# Patient Record
Sex: Female | Born: 2000 | Race: White | Hispanic: No | Smoking: Never smoker
Health system: Southern US, Community
[De-identification: ages and names within clinical notes are randomized; demographics above are authoritative.]

## PROBLEM LIST (undated history)

## (undated) DIAGNOSIS — F329 Major depressive disorder, single episode, unspecified: Secondary | ICD-10-CM

## (undated) DIAGNOSIS — F32A Depression, unspecified: Secondary | ICD-10-CM

## (undated) DIAGNOSIS — G43909 Migraine, unspecified, not intractable, without status migrainosus: Secondary | ICD-10-CM

## (undated) DIAGNOSIS — F419 Anxiety disorder, unspecified: Secondary | ICD-10-CM

## (undated) DIAGNOSIS — R51 Headache: Secondary | ICD-10-CM

## (undated) DIAGNOSIS — M25512 Pain in left shoulder: Secondary | ICD-10-CM

## (undated) DIAGNOSIS — J189 Pneumonia, unspecified organism: Secondary | ICD-10-CM

## (undated) HISTORY — DX: Headache: R51

## (undated) HISTORY — PX: WISDOM TOOTH EXTRACTION: SHX21

---
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
## 2019-11-30 ENCOUNTER — Ambulatory Visit: Payer: BC Managed Care – PPO | Attending: Internal Medicine

## 2019-11-30 DIAGNOSIS — Z20822 Contact with and (suspected) exposure to covid-19: Secondary | ICD-10-CM

## 2019-12-02 LAB — NOVEL CORONAVIRUS, NAA: SARS-CoV-2, NAA: NOT DETECTED

## 2019-12-09 ENCOUNTER — Other Ambulatory Visit: Payer: BC Managed Care – PPO

## 2020-08-18 ENCOUNTER — Other Ambulatory Visit: Payer: Self-pay

## 2020-08-18 ENCOUNTER — Ambulatory Visit
Admission: RE | Admit: 2020-08-18 | Discharge: 2020-08-18 | Disposition: A | Discharge status: Home / Self Care | Payer: BC Managed Care – PPO | Source: Ambulatory Visit | Attending: Emergency Medicine | Admitting: Emergency Medicine

## 2020-08-18 VITALS — BP 122/81 | HR 71 | Temp 99.3°F | Resp 16

## 2020-08-18 DIAGNOSIS — R319 Hematuria, unspecified: Secondary | ICD-10-CM | POA: Diagnosis present

## 2020-08-18 DIAGNOSIS — N390 Urinary tract infection, site not specified: Secondary | ICD-10-CM | POA: Insufficient documentation

## 2020-08-18 LAB — POCT URINALYSIS DIP (MANUAL ENTRY)
Bilirubin, UA: NEGATIVE
Glucose, UA: NEGATIVE mg/dL
Nitrite, UA: POSITIVE — AB
Protein Ur, POC: 100 mg/dL — AB
Spec Grav, UA: 1.025 (ref 1.010–1.025)
Urobilinogen, UA: 1 E.U./dL
pH, UA: 8 (ref 5.0–8.0)

## 2020-08-18 LAB — POCT URINE PREGNANCY: Preg Test, Ur: NEGATIVE

## 2020-08-18 MED ORDER — CEPHALEXIN 500 MG PO CAPS: 500.0000 mg | ORAL_CAPSULE | Freq: Two times a day (BID) | ORAL | 0 refills | Status: AC

## 2020-08-18 NOTE — Discharge Instructions (Signed)
Take the antibiotic as directed.  The urine culture is pending.  We will call you if it shows the need to change or discontinue your antibiotic.    Follow up with your primary care provider if your symptoms are not improving.    

## 2020-08-18 NOTE — ED Triage Notes (Signed)
Patient complains of urinary frequency and lower abdominal pressure. Reports it started yesterday. States that she has also noticed some spotting on the toilet tissue.

## 2020-08-18 NOTE — ED Provider Notes (Signed)
Jenny Grant    CSN: [id number] Arrival date & time: 08/18/20  1614      History   Chief Complaint Chief Complaint  Patient presents with  . Abdominal Pain  . Dysuria    HPI Jenny Grant is a 19 y.o. female.   Patient presents with dysuria and suprapubic pressure x1 day.  She denies fever, chills, back pain, rash, lesions, vaginal discharge, pelvic pain, or other symptoms.  No treatments attempted at home.  The history is provided by the patient.    Past Medical History:  Diagnosis Date  . Depression   . Headache(784.0)   . Migraines     Patient Active Problem List   Diagnosis Date Noted  . Migraine without aura and without status migrainosus, not intractable 05/12/2014  . Tension type headache 05/12/2014  . Anxiety state, unspecified 05/12/2014  . Depressed mood 05/12/2014    Past Surgical History:  Procedure Laterality Date  . WISDOM TOOTH EXTRACTION      OB History   No obstetric history on file.      Home Medications    Prior to Admission medications   Medication Sig Start Date End Date Taking? Authorizing Provider  acetaminophen (TYLENOL) 500 MG tablet Take 500 mg by mouth every 6 (six) hours as needed for mild pain, fever or headache.     [provider]  cephALEXin (KEFLEX) 500 MG capsule Take 1 capsule (500 mg total) by mouth 2 (two) times daily for 5 days. 08/18/20 08/23/20  Mickie Bail, NP  Norgestimate-Ethinyl Estradiol Triphasic (TRI-PREVIFEM) 0.18/0.215/0.25 MG-35 MCG tablet Take 1 tablet by mouth at bedtime.     [provider]  Pseudoephedrine-APAP-DM (DAYQUIL PO) Take 1 capsule by mouth daily as needed (for cold/allergy symptoms).    [provider]  SUMAtriptan (IMITREX) 25 MG tablet Take 25 mg by mouth every 3 (three) hours as needed for migraine. May repeat up to 4 times in 24 hours if headache persists or recurs.    [provider]    Family History Family History  Problem Relation  Age of Onset  . Cerebral palsy Brother        1 brother has CP  . Seizures Brother   . Lung cancer Maternal Grandmother   . Lung cancer Maternal Grandfather   . Cancer Paternal Grandfather     Social History Social History   Tobacco Use  . Smoking status: Never Smoker  . Smokeless tobacco: Never Used  Vaping Use  . Vaping Use: Never used  Substance Use Topics  . Alcohol use: Yes    Comment: occasionally  . Drug use: No     Allergies   Patient has no known allergies.   Review of Systems Review of Systems  Constitutional: Negative for chills and fever.  HENT: Negative for ear pain and sore throat.   Eyes: Negative for pain and visual disturbance.  Respiratory: Negative for cough and shortness of breath.   Cardiovascular: Negative for chest pain and palpitations.  Gastrointestinal: Positive for abdominal pain. Negative for vomiting.  Genitourinary: Positive for dysuria. Negative for flank pain, hematuria, pelvic pain and vaginal discharge.  Musculoskeletal: Negative for arthralgias and back pain.  Skin: Negative for color change and rash.  Neurological: Negative for seizures and syncope.  All other systems reviewed and are negative.    Physical Exam Triage Vital Signs ED Triage Vitals  Enc Vitals Group     BP      Pulse  Resp      Temp      Temp src      SpO2      Weight      Height      Head Circumference      Peak Flow      Pain Score      Pain Loc      Pain Edu?      Excl. in GC?    No data found.  Updated Vital Signs BP 122/81   Pulse 71   Temp 99.3 F (37.4 C)   Resp 16   LMP 08/04/2020 (Within Days)   SpO2 98%   Visual Acuity Right Eye Distance:   Left Eye Distance:   Bilateral Distance:    Right Eye Near:   Left Eye Near:    Bilateral Near:     Physical Exam Vitals and nursing note reviewed.  Constitutional:      General: She is not in acute distress.    Appearance: She is well-developed. She is not ill-appearing.  HENT:       Head: Normocephalic and atraumatic.     Mouth/Throat:     Mouth: Mucous membranes are moist.  Eyes:     Conjunctiva/sclera: Conjunctivae normal.  Cardiovascular:     Rate and Rhythm: Normal rate and regular rhythm.     Heart sounds: No murmur heard.   Pulmonary:     Effort: Pulmonary effort is normal. No respiratory distress.     Breath sounds: Normal breath sounds.  Abdominal:     General: Bowel sounds are normal.     Palpations: Abdomen is soft.     Tenderness: There is abdominal tenderness in the suprapubic area. There is no right CVA tenderness, left CVA tenderness, guarding or rebound.     Comments: Mild suprapubic tenderness to palpation.  No rebound or guarding.  Musculoskeletal:     Cervical back: Neck supple.  Skin:    General: Skin is warm and dry.     Findings: No rash.  Neurological:     General: No focal deficit present.     Mental Status: She is alert and oriented to person, place, and time.     Gait: Gait normal.  Psychiatric:        Mood and Affect: Mood normal.        Behavior: Behavior normal.      UC Treatments / Results  Labs (all labs ordered are listed, but only abnormal results are displayed) Labs Reviewed  POCT URINALYSIS DIP (MANUAL ENTRY) - Abnormal; Notable for the following components:      Result Value   Clarity, UA cloudy (*)    Ketones, POC UA trace (5) (*)    Blood, UA large (*)    Protein Ur, POC =100 (*)    Nitrite, UA Positive (*)    Leukocytes, UA Moderate (2+) (*)    All other components within normal limits  URINE CULTURE  POCT URINE PREGNANCY    EKG   Radiology No results found.  Procedures Procedures (including critical care time)  Medications Ordered in UC Medications - No data to display  Initial Impression / Assessment and Plan / UC Course  I have reviewed the triage vital signs and the nursing notes.  Pertinent labs & imaging results that were available during my care of the patient were reviewed by me  and considered in my medical decision making (see chart for details).   UTI.  Urine culture pending.  Treating with Keflex.  Discussed with patient that we will call her if the culture shows the need to change or discontinue her antibiotic.  Instructed her to follow-up with her PCP if her symptoms are not improving.  Patient agrees to plan of care.   Final Clinical Impressions(s) / UC Diagnoses   Final diagnoses:  Urinary tract infection with hematuria, site unspecified     Discharge Instructions     Take the antibiotic as directed.  The urine culture is pending.  We will call you if it shows the need to change or discontinue your antibiotic.    Follow up with your primary care provider if your symptoms are not improving.       ED Prescriptions    Medication Sig Dispense Auth. Provider   cephALEXin (KEFLEX) 500 MG capsule Take 1 capsule (500 mg total) by mouth 2 (two) times daily for 5 days. 10 capsule Mickie Bail, NP     PDMP not reviewed this encounter.   Mickie Bail, NP 08/18/20 [phone]

## 2020-08-20 LAB — URINE CULTURE: Culture: >=100000 — AB

## 2020-10-28 ENCOUNTER — Other Ambulatory Visit: Payer: Self-pay

## 2020-10-28 ENCOUNTER — Ambulatory Visit
Admission: RE | Admit: 2020-10-28 | Discharge: 2020-10-28 | Disposition: A | Discharge status: Home / Self Care | Payer: BC Managed Care – PPO | Source: Ambulatory Visit | Attending: Emergency Medicine | Admitting: Emergency Medicine

## 2020-10-28 ENCOUNTER — Ambulatory Visit (INDEPENDENT_AMBULATORY_CARE_PROVIDER_SITE_OTHER): Payer: BC Managed Care – PPO

## 2020-10-28 VITALS — BP 138/91 | HR 106 | Temp 98.1°F | Resp 15 | Ht 64.0 in | Wt 115.0 lb

## 2020-10-28 DIAGNOSIS — R079 Chest pain, unspecified: Secondary | ICD-10-CM

## 2020-10-28 DIAGNOSIS — R4589 Other symptoms and signs involving emotional state: Secondary | ICD-10-CM | POA: Diagnosis not present

## 2020-10-28 LAB — CBC WITH DIFFERENTIAL/PLATELET
Abs Immature Granulocytes: 0.01 10*3/uL (ref 0.00–0.07)
Basophils Absolute: 0.1 10*3/uL (ref 0.0–0.1)
Basophils Relative: 1 %
Eosinophils Absolute: 0.1 10*3/uL (ref 0.0–0.5)
Eosinophils Relative: 1 %
HCT: 41.9 % (ref 36.0–46.0)
Hemoglobin: 14.2 g/dL (ref 12.0–15.0)
Immature Granulocytes: 0 %
Lymphocytes Relative: 29 %
Lymphs Abs: 1.9 10*3/uL (ref 0.7–4.0)
MCH: 30 pg (ref 26.0–34.0)
MCHC: 33.9 g/dL (ref 30.0–36.0)
MCV: 88.4 fL (ref 80.0–100.0)
Monocytes Absolute: 0.5 10*3/uL (ref 0.1–1.0)
Monocytes Relative: 8 %
Neutro Abs: 4.1 10*3/uL (ref 1.7–7.7)
Neutrophils Relative %: 61 %
Platelets: 353 10*3/uL (ref 150–400)
RBC: 4.74 MIL/uL (ref 3.87–5.11)
RDW: 13.5 % (ref 11.5–15.5)
WBC: 6.6 10*3/uL (ref 4.0–10.5)
nRBC: 0 % (ref 0.0–0.2)

## 2020-10-28 LAB — BASIC METABOLIC PANEL
Anion gap: 8 (ref 5–15)
BUN: 11 mg/dL (ref 6–20)
CO2: 26 mmol/L (ref 22–32)
Calcium: 9.8 mg/dL (ref 8.9–10.3)
Chloride: 104 mmol/L (ref 98–111)
Creatinine, Ser: 0.83 mg/dL (ref 0.44–1.00)
GFR, Estimated: >60 mL/min (ref >60)
Glucose, Bld: 104 mg/dL — ABNORMAL HIGH (ref 70–99)
Potassium: 3.7 mmol/L (ref 3.5–5.1)
Sodium: 138 mmol/L (ref 135–145)

## 2020-10-28 LAB — TROPONIN I (HIGH SENSITIVITY): Troponin I (High Sensitivity): 3 ng/L (ref <18)

## 2020-10-28 LAB — FIBRIN DERIVATIVES D-DIMER (ARMC ONLY): Fibrin derivatives D-dimer (ARMC): 106.52 ng/mL (FEU) (ref 0.00–499.00)

## 2020-10-28 LAB — TSH: TSH: 0.626 u[IU]/mL (ref 0.350–4.500)

## 2020-10-28 MED ORDER — LIDOCAINE VISCOUS HCL 2 % MT SOLN
15.0000 mL | Freq: Once | OROMUCOSAL | Status: AC
  Administered 2020-10-28: 15 mL via ORAL

## 2020-10-28 MED ORDER — ALUM & MAG HYDROXIDE-SIMETH 200-200-20 MG/5ML PO SUSP
30.0000 mL | Freq: Once | ORAL | Status: AC
  Administered 2020-10-28: 30 mL via ORAL

## 2020-10-28 MED ORDER — HYDROXYZINE HCL 50 MG PO TABS: 50.0000 mg | ORAL_TABLET | Freq: Four times a day (QID) | ORAL | 1 refills | Status: DC | PRN

## 2020-10-28 NOTE — Discharge Instructions (Addendum)
Your labs and imaging were normal.  I do not think that this is a blood clot in your lungs, heart attack, and infection in your heart.  However, follow-up with cardiology as soon as you can.  I am giving you some Vistaril in case anxiety is aggravating the pain.  Can try 400 mg of ibuprofen combined with 500 mg of Tylenol as needed 3-4 times a day for the pain as well.  Go immediately to the emergency department if the pain changes, gets worse, if you become sweaty or nauseous with the pain, for any other concerns.  Here is a list of primary care providers who are taking new patients:  Dr. Normalee Sauer [address] [place] [address] [phone]  [facility] [address] [place], [place] [address] [phone]  [facility] [place] [address] [place] [address]  [phone]  [facility] [address] [place], [place] [address] [phone]  [facility] [address] [place]  [phone] [place], [place] [address]  Here are clinics/ other resources who will see you if you do not have insurance. Some have certain criteria that you must meet. Call them and find out what they are:  [facility]: [address], [place], [place] [address] Phone: [phone] Hours: First and Third Saturdays of each Month, 9 a.m. - 1 p.m.  [facility]: [address], [address] [place], [place] [address] Phone: [phone] Hours: Tuesday, 4 p.m. - 8 p.m. Thursday, 1 p.m. - 8 p.m. Wednesday, 9 a.m. - [facility] [address] [place], [place] [address] Phone: [phone] Pharmacy Phone Number: [phone] Dental Phone Number: [phone] [facility] Insurance Help: [phone]  Dental Hours: Monday - Thursday, 8 a.m. - 6 p.m.  [facility] [address], [place], [place] [address] Phone: [phone] Pharmacy Phone Number: [phone] [facility] Insurance Help:  [phone]  [facility] [address] [place] [place], [place] [address] Phone: [phone] Pharmacy Phone Number: [phone] [facility] Insurance Help: [phone]  [facility] [address] [place], [place] [address] Phone: [phone] [facility] Insurance Help: [phone]   [facility] [address], [place], [place] [address] Phone: [phone]  Go to www.goodrx.com to look up your medications. This will give you a list of where you can find your prescriptions at the most affordable prices. Or ask the pharmacist what the cash price is, or if they have any other discount programs available to help make your medication more affordable. This can be less expensive than what you would pay with insurance.

## 2020-10-28 NOTE — ED Provider Notes (Addendum)
HPI  SUBJECTIVE:  Jenny Grant is a 19 y.o. female who presents with 1 week of substernal chest pain described as tightness that was intermittent that has now become constant today.  She reports sharp substernal chest pain that is present only with breathing.  She reports shortness of breath.  No trauma to the chest, change in physical activity.  No fevers, coughing, wheezing, hemoptysis.  No nausea, diaphoresis, exertional component to this chest pain.  She reports palpitations/elevated heart rate with the chest tightness that can last up to 30 minutes.  She states that she discontinued OCPs 1 month ago.  No calf pain, swelling, surgery in the past 4 weeks, recent immobilization.  She denies stimulant or illicit drug use.  No recent viral illness or  Covid vaccine.  Denies GERD symptoms.  She reports anxiety because of the chest pain, not anxiety followed by chest pain.  She has tried deep breathing which helps.  She also states that lying down helps.  Symptoms are worse with leaning forward, deep inspiration, large positional changes, standing.  She vapes.  She has a history of anxiety and GERD, which has resolved with weight loss.  No history of asthma, emphysema, COPD, diabetes, coronary disease, MI, DVT, PE, hypercholesterolemia, obesity, hypertension, hypothyroidism.  Family history negative for MI, DVT, PE.  LMP: Last week.  Denies the possibility being pregnant.  PMD: None.    Past Medical History:  Diagnosis Date  . Depression   . Headache(784.0)   . Migraines     Past Surgical History:  Procedure Laterality Date  . WISDOM TOOTH EXTRACTION      Family History  Problem Relation Age of Onset  . Cerebral palsy Brother        1 brother has CP  . Seizures Brother   . Lung cancer Maternal Grandmother   . Lung cancer Maternal Grandfather   . Cancer Paternal Grandfather     Social History   Tobacco Use  . Smoking status: Never Smoker  . Smokeless tobacco: Never Used   Vaping Use  . Vaping Use: Every day  Substance Use Topics  . Alcohol use: Yes    Comment: occasionally  . Drug use: Yes    Types: Marijuana     Current Facility-Administered Medications:  .  alum & mag hydroxide-simeth (MAALOX/MYLANTA) 200-200-20 MG/5ML suspension 30 mL, 30 mL, Oral, Once **AND** lidocaine (XYLOCAINE) 2 % viscous mouth solution 15 mL, 15 mL, Oral, Once, Domenick Gong, MD  Current Outpatient Medications:  .  acetaminophen (TYLENOL) 500 MG tablet, Take 500 mg by mouth every 6 (six) hours as needed for mild pain, fever or headache. , Disp: , Rfl:  .  Norgestimate-Ethinyl Estradiol Triphasic (TRI-PREVIFEM) 0.18/0.215/0.25 MG-35 MCG tablet, Take 1 tablet by mouth at bedtime. , Disp: , Rfl:  .  Pseudoephedrine-APAP-DM (DAYQUIL PO), Take 1 capsule by mouth daily as needed (for cold/allergy symptoms)., Disp: , Rfl:  .  SUMAtriptan (IMITREX) 25 MG tablet, Take 25 mg by mouth every 3 (three) hours as needed for migraine. May repeat up to 4 times in 24 hours if headache persists or recurs., Disp: , Rfl:   No Known Allergies   ROS  As noted in HPI.   Physical Exam  BP (!) 138/91 (BP Location: Right Arm)   Pulse (!) 106   Temp 98.1 F (36.7 C) (Oral)   Resp 15   Ht 5\' 4"  (1.626 m)   Wt 52.2 kg   LMP 10/14/2020 (Approximate)   SpO2  100%   BMI 19.74 kg/m   Constitutional: Well developed, well nourished, no acute distress Eyes:  EOMI, conjunctiva normal bilaterally HENT: Normocephalic, atraumatic,mucus membranes moist Respiratory: Normal inspiratory effort, lungs clear bilaterally. Cardiovascular: Regular tachycardia, no murmurs rubs or gallops.  No costochondral tenderness.  No reproducible chest wall tenderness. GI: nondistended, soft, nontender skin: No rash, skin intact Musculoskeletal: Calves symmetric.  Mild bilateral calf tenderness.  No popliteal tenderness.  No palpable cord.  No edema.  No medial thigh tenderness. Neurologic: Alert & oriented x 3, no  focal neuro deficits Psychiatric: Anxious, speech and behavior appropriate   ED Course   Medications  alum & mag hydroxide-simeth (MAALOX/MYLANTA) 200-200-20 MG/5ML suspension 30 mL (has no administration in time range)    And  lidocaine (XYLOCAINE) 2 % viscous mouth solution 15 mL (has no administration in time range)    Orders Placed This Encounter  Procedures  . DG Chest 2 View    Standing Status:   Standing    Number of Occurrences:   1    Order Specific Question:   Symptom/Reason for Exam    Answer:   Chest pain [[id number]]  . Basic metabolic panel    Standing Status:   Standing    Number of Occurrences:   1  . CBC with Differential    Standing Status:   Standing    Number of Occurrences:   1  . Fibrin derivatives D-Dimer    Standing Status:   Standing    Number of Occurrences:   1  . TSH    Standing Status:   Standing    Number of Occurrences:   1  . ED EKG    Standing Status:   Standing    Number of Occurrences:   1    Order Specific Question:   Reason for Exam    Answer:   Chest Pain  . EKG 12-Lead    Standing Status:   Standing    Number of Occurrences:   1    Results for orders placed or performed during the hospital encounter of 10/28/20 (from the past 24 hour(s))  Basic metabolic panel     Status: Abnormal   Collection Time: 10/28/20  5:48 PM  Result Value Ref Range   Sodium 138 135 - 145 mmol/L   Potassium 3.7 3.5 - 5.1 mmol/L   Chloride 104 98 - 111 mmol/L   CO2 26 22 - 32 mmol/L   Glucose, Bld 104 (H) 70 - 99 mg/dL   BUN 11 6 - 20 mg/dL   Creatinine, Ser 2.37 0.44 - 1.00 mg/dL   Calcium 9.8 8.9 - 62.8 mg/dL   GFR, Estimated >31 >51 mL/min   Anion gap 8 5 - 15  CBC with Differential     Status: None   Collection Time: 10/28/20  5:48 PM  Result Value Ref Range   WBC 6.6 4.0 - 10.5 K/uL   RBC 4.74 3.87 - 5.11 MIL/uL   Hemoglobin 14.2 12.0 - 15.0 g/dL   HCT 76.1 36 - 46 %   MCV 88.4 80.0 - 100.0 fL   MCH 30.0 26.0 - 34.0 pg   MCHC 33.9 30.0 -  36.0 g/dL   RDW 60.7 37.1 - 06.2 %   Platelets 353 150 - 400 K/uL   nRBC 0.0 0.0 - 0.2 %   Neutrophils Relative % 61 %   Neutro Abs 4.1 1.7 - 7.7 K/uL   Lymphocytes Relative 29 %   Lymphs Abs  1.9 0.7 - 4.0 K/uL   Monocytes Relative 8 %   Monocytes Absolute 0.5 0.1 - 1.0 K/uL   Eosinophils Relative 1 %   Eosinophils Absolute 0.1 0.0 - 0.5 K/uL   Basophils Relative 1 %   Basophils Absolute 0.1 0.0 - 0.1 K/uL   Immature Granulocytes 0 %   Abs Immature Granulocytes 0.01 0.00 - 0.07 K/uL  Fibrin derivatives D-Dimer     Status: None   Collection Time: 10/28/20  5:48 PM  Result Value Ref Range   Fibrin derivatives D-dimer ([facility]) 106.52 0.00 - 499.00 ng/mL (FEU)  Troponin I (High Sensitivity)     Status: None   Collection Time: 10/28/20  5:48 PM  Result Value Ref Range   Troponin I (High Sensitivity) 3 <18 ng/L   DG Chest 2 View  Result Date: 10/28/2020 CLINICAL DATA:  19 year old female with chest pain. EXAM: CHEST - 2 VIEW COMPARISON:  None. FINDINGS: The heart size and mediastinal contours are within normal limits. Both lungs are clear. The visualized skeletal structures are unremarkable. IMPRESSION: No active cardiopulmonary disease. Electronically Signed   By: Elgie Collard M.D.   On: 10/28/2020 18:52    ED Clinical Impression  1. Chest pain      ED Assessment/Plan  EKG: Normal sinus rhythm, rate 80.  Normal axis, normal intervals.  No hypertrophy.  No ST-T wave changes.  No previous EKG for comparison.  She was symptomatic while EKG was obtained.  Patient has nonreproducible chest wall tenderness.  She was tachycardic on presentation although this normalized on my exam.  However she does have bilateral calf tenderness and recent OCP use so will check a D-dimer.  No evidence of pericarditis on EKG.  Will get troponin to evaluate for myocarditis.  Chest x-ray to evaluate for other causes of chest pain.  TSH because of the palpitations.  Further history, patient states that  she spends a lot of time on her feet, suspect that the calf pain is musculoskeletal because it is bilateral.  Reviewed imaging independently.  Normal chest x-ray.  See radiology report for full details.  will try a GI cocktail.  If she improves with this, then she can start some Pepcid at home.  Labs, imaging reviewed.  BMP normal, D-dimer negative, troponin negative, no anemia.  TSH pending.  Chest x-ray normal.  Unsure as to the etiology of the patient's chest pain, however does not appear to be an MI, myocarditis, PE, pneumonia, dissection, pneumothorax.  Could be proximal muscle SVT, hyperthyroidism.  Ordered referral  to cardiology and providing primary care list for ongoing care.  will try some Vistaril for anxiety in case this is contributing to her chest pain.  Strict ER return precautions.  Discussed labs, imaging, MDM, treatment plan, and plan for follow-up with patient. Discussed sn/sx that should prompt return to the ED. patient agrees with plan.   Meds ordered this encounter  Medications  . AND Linked Order Group   . alum & mag hydroxide-simeth (MAALOX/MYLANTA) 200-200-20 MG/5ML suspension 30 mL   . lidocaine (XYLOCAINE) 2 % viscous mouth solution 15 mL    *This clinic note was created using Scientist, clinical (histocompatibility and immunogenetics). Therefore, there may be occasional mistakes despite careful proofreading.   ?    Domenick Gong, MD 10/28/20 2040    Domenick Gong, MD 10/29/20 [phone]

## 2020-10-28 NOTE — ED Triage Notes (Signed)
Patient c/o chest pain off and on for over a week.  Patient reports that she has been having anxiety and panic attacks recently.  Patient reports pain when she takes a deep breath.

## 2020-11-11 ENCOUNTER — Other Ambulatory Visit: Payer: Self-pay

## 2020-11-11 ENCOUNTER — Ambulatory Visit (INDEPENDENT_AMBULATORY_CARE_PROVIDER_SITE_OTHER): Payer: BC Managed Care – PPO | Admitting: Cardiology

## 2020-11-11 ENCOUNTER — Encounter: Payer: Self-pay | Admitting: Cardiology

## 2020-11-11 VITALS — BP 100/78 | HR 88 | Ht 64.0 in | Wt 114.2 lb

## 2020-11-11 DIAGNOSIS — Z7289 Other problems related to lifestyle: Secondary | ICD-10-CM | POA: Diagnosis not present

## 2020-11-11 DIAGNOSIS — F419 Anxiety disorder, unspecified: Secondary | ICD-10-CM | POA: Diagnosis not present

## 2020-11-11 DIAGNOSIS — R079 Chest pain, unspecified: Secondary | ICD-10-CM | POA: Diagnosis not present

## 2020-11-11 NOTE — Patient Instructions (Signed)
Medication Instructions:  Your physician recommends that you continue on your current medications as directed. Please refer to the Current Medication list given to you today.  *If you need a refill on your cardiac medications before your next appointment, please call your pharmacy*   Lab Work: None ordered If you have labs (blood work) drawn today and your tests are completely normal, you will receive your results only by: Marland Kitchen MyChart Message (if you have MyChart) OR . A paper copy in the mail If you have any lab test that is abnormal or we need to change your treatment, we will call you to review the results.   Testing/Procedures: Your physician has requested that you have an echocardiogram. Echocardiography is a painless test that uses sound waves to create images of your heart. It provides your doctor with information about the size and shape of your heart and how well your heart's chambers and valves are working. This procedure takes approximately one hour. There are no restrictions for this procedure.     Follow-Up: At [facility], you and your health needs are our priority.  As part of our continuing mission to provide you with exceptional heart care, we have created designated Provider Care Teams.  These Care Teams include your primary Cardiologist (physician) and Advanced Practice Providers (APPs -  Physician Assistants and Nurse Practitioners) who all work together to provide you with the care you need, when you need it.  We recommend signing up for the patient portal called "MyChart".  Sign up information is provided on this After Visit Summary.  MyChart is used to connect with patients for Virtual Visits (Telemedicine).  Patients are able to view lab/test results, encounter notes, upcoming appointments, etc.  Non-urgent messages can be sent to your provider as well.   To learn more about what you can do with MyChart, go to [email or web address].    Your next appointment:    After the echo   The format for your next appointment:   In Person  Provider:   You may see Dr. Azucena Cecil or one of the following Advanced Practice Providers on your designated Care Team:    Nicolasa Ducking, NP  Eula Listen, PA-C  Marisue Ivan, PA-C  Cadence Benjamin, [place]  Gillian Shields, NP    Other Instructions  Echocardiogram An echocardiogram is a procedure that uses painless sound waves (ultrasound) to produce an image of the heart. Images from an echocardiogram can provide important information about:  Signs of coronary artery disease (CAD).  Aneurysm detection. An aneurysm is a weak or damaged part of an artery wall that bulges out from the normal force of blood pumping through the body.  Heart size and shape. Changes in the size or shape of the heart can be associated with certain conditions, including heart failure, aneurysm, and CAD.  Heart muscle function.  Heart valve function.  Signs of a past heart attack.  Fluid buildup around the heart.  Thickening of the heart muscle.  A tumor or infectious growth around the heart valves. Tell a health care provider about:  Any allergies you have.  All medicines you are taking, including vitamins, herbs, eye drops, creams, and over-the-counter medicines.  Any blood disorders you have.  Any surgeries you have had.  Any medical conditions you have.  Whether you are pregnant or may be pregnant. What are the risks? Generally, this is a safe procedure. However, problems may occur, including:  Allergic reaction to dye (contrast) that may be used during  the procedure. What happens before the procedure? No specific preparation is needed. You may eat and drink normally. What happens during the procedure?   An IV tube may be inserted into one of your veins.  You may receive image enhancer through this tube. An image enhancer is an injection that improves the quality of the pictures from your heart.  A  gel will be applied to your chest.  A wand-like tool (transducer) will be moved over your chest. The gel will help to transmit the sound waves from the transducer.  The sound waves will harmlessly bounce off of your heart to allow the heart images to be captured in real-time motion. The images will be recorded on a computer. The procedure may vary among health care providers and hospitals. What happens after the procedure?  You may return to your normal, everyday life, including diet, activities, and medicines, unless your health care provider tells you not to do that. Summary  An echocardiogram is a procedure that uses painless sound waves (ultrasound) to produce an image of the heart.  Images from an echocardiogram can provide important information about the size and shape of your heart, heart muscle function, heart valve function, and fluid buildup around your heart.  You do not need to do anything to prepare before this procedure. You may eat and drink normally.  After the echocardiogram is completed, you may return to your normal, everyday life, unless your health care provider tells you not to do that. This information is not intended to replace advice given to you by your health care provider. Make sure you discuss any questions you have with your health care provider. Document Revised: 03/12/2019 Document Reviewed: 12/22/2016 Elsevier Patient Education  2020 [employer].

## 2020-11-11 NOTE — Progress Notes (Signed)
Cardiology Office Note:    Date:  11/11/2020   ID:  Jenny Grant, DOB 2001-11-29, MRN [id number]  PCP:  Herb Grays, MD  [facility] Cardiologist:  No primary care provider on file.  [facility] Electrophysiologist:  None   Referring MD: Domenick Gong, MD   Chief Complaint  Patient presents with  . New Patient (Initial Visit)    Referred by ED for Chest Pain  Pt states she has sharp chest pain that occurs whenever she gets worked up and sometimes randomly, states that it does not last long, chest feels a little tight as well. Has not been able to wear a bra because the pressure can sometimes cause more chest pain.  . Weight Loss    Pt states 6 months ago, she weighed close to 200lbs. She states she does not work out, states "it just happened." She wonders if this could be the cause of her chest pain.   Jenny Grant is a 19 y.o. female who is being seen today for the evaluation of chest pain at the request of Domenick Gong, MD.   History of Present Illness:    Jenny Grant is a 19 y.o. female with a hx of anxiety, currently vapes who presents due to chest pain.  States having symptoms of chest discomfort beginning about 2 weeks ago.  At the time she had a severe panic attack associated with chest pounding and discomfort.  She was seen at urgent care where EKG did not show any ischemia, troponin was normal.  Atarax was given for patient to take as needed as her symptoms are consistent with anxiety.  Her symptoms of panic attack and chest pain improved while on Atarax.  Occasionally has chest discomfort sometimes when she takes a deep breath but not as bad as before.  Denies smoking cigarettes but vapes.  Denies any history of heart disease.  Denies any recent fevers or respiratory or viral illness.    Past Medical History:  Diagnosis Date  . Depression   . Headache(784.0)   . Migraines     Past Surgical History:  Procedure Laterality Date   . WISDOM TOOTH EXTRACTION      Current Medications: Current Meds  Medication Sig  . hydrOXYzine (ATARAX/VISTARIL) 50 MG tablet Take 1 tablet (50 mg total) by mouth every 6 (six) hours as needed for anxiety. May take 2 tabs at night     Allergies:   Patient has no known allergies.   Social History   Socioeconomic History  . Marital status: Single    Spouse name: Not on file  . Number of children: Not on file  . Years of education: Not on file  . Highest education level: Not on file  Occupational History  . Not on file  Tobacco Use  . Smoking status: Never Smoker  . Smokeless tobacco: Never Used  Vaping Use  . Vaping Use: Every day  Substance and Sexual Activity  . Alcohol use: Yes    Comment: occasionally  . Drug use: Yes    Types: Marijuana  . Sexual activity: Yes    Birth control/protection: None  Other Topics Concern  . Not on file  Social History Narrative  . Not on file   Social Determinants of Health   Financial Resource Strain: Not on file  Food Insecurity: Not on file  Transportation Needs: Not on file  Physical Activity: Not on file  Stress: Not on file  Social Connections: Not on file  Family History: The patient's family history includes Cancer in her paternal grandfather; Cerebral palsy in her brother; Lung cancer in her maternal grandfather and maternal grandmother; Seizures in her brother.  ROS:   Please see the history of present illness.     All other systems reviewed and are negative.  EKGs/Labs/Other Studies Reviewed:    The following studies were reviewed today:   EKG:  EKG is  ordered today.  The ekg ordered today demonstrates normal sinus rhythm, normal ECG.  Recent Labs: 10/28/2020: BUN 11; Creatinine, Ser 0.83; Hemoglobin 14.2; Platelets 353; Potassium 3.7; Sodium 138; TSH 0.626  Recent Lipid Panel No results found for: CHOL, TRIG, HDL, CHOLHDL, VLDL, LDLCALC, LDLDIRECT   Risk Assessment/Calculations:      Physical  Exam:    VS:  BP 100/78   Pulse 88   Ht 5\' 4"  (1.626 m)   Wt 114 lb 3.2 oz (51.8 kg)   LMP 10/14/2020 (Approximate)   BMI 19.60 kg/m     Wt Readings from Last 3 Encounters:  11/11/20 114 lb 3.2 oz (51.8 kg)  10/28/20 115 lb (52.2 kg)  05/04/17 120 lb (54.4 kg) (42 %, Z= -0.20)*   * Growth percentiles are based on CDC (Girls, 2-20 Years) data.     GEN:  Well nourished, well developed in no acute distress HEENT: Normal NECK: No JVD; No carotid bruits LYMPHATICS: No lymphadenopathy CARDIAC: RRR, no murmurs, rubs, gallops RESPIRATORY:  Clear to auscultation without rales, wheezing or rhonchi  ABDOMEN: Soft, non-tender, non-distended MUSCULOSKELETAL:  No edema; No deformity  SKIN: Warm and dry NEUROLOGIC:  Alert and oriented x 3 PSYCHIATRIC:  Normal affect   ASSESSMENT:    1. Chest pain of uncertain etiology   2. Anxiety   3. Engages in vaping    PLAN:    In order of problems listed above:  1. Patient with chest pain not associated with exertion.  Has some pleuritic component to her symptoms.  Get echocardiogram to evaluate for pericardial effusion, cardiac function.  Symptoms are not consistent with CAD, patient is young, has no risk factors.  Anxiety most likely playing a role. 2. History of anxiety, recommend patient establish with a primary care provider/psychiatrist for adequate management of anxiety. 3. Currently uses e-cigarettes.  Cessation advised.  follow up after echocardiogram.     Medication Adjustments/Labs and Tests Ordered: Current medicines are reviewed at length with the patient today.  Concerns regarding medicines are outlined above.  Orders Placed This Encounter  Procedures  . EKG 12-Lead  . ECHOCARDIOGRAM COMPLETE   No orders of the defined types were placed in this encounter.   Patient Instructions  Medication Instructions:  Your physician recommends that you continue on your current medications as directed. Please refer to the Current  Medication list given to you today.  *If you need a refill on your cardiac medications before your next appointment, please call your pharmacy*   Lab Work: None ordered If you have labs (blood work) drawn today and your tests are completely normal, you will receive your results only by: 07/04/17 MyChart Message (if you have MyChart) OR . A paper copy in the mail If you have any lab test that is abnormal or we need to change your treatment, we will call you to review the results.   Testing/Procedures: Your physician has requested that you have an echocardiogram. Echocardiography is a painless test that uses sound waves to create images of your heart. It provides your doctor with information about  the size and shape of your heart and how well your heart's chambers and valves are working. This procedure takes approximately one hour. There are no restrictions for this procedure.     Follow-Up: At [facility], you and your health needs are our priority.  As part of our continuing mission to provide you with exceptional heart care, we have created designated Provider Care Teams.  These Care Teams include your primary Cardiologist (physician) and Advanced Practice Providers (APPs -  Physician Assistants and Nurse Practitioners) who all work together to provide you with the care you need, when you need it.  We recommend signing up for the patient portal called "MyChart".  Sign up information is provided on this After Visit Summary.  MyChart is used to connect with patients for Virtual Visits (Telemedicine).  Patients are able to view lab/test results, encounter notes, upcoming appointments, etc.  Non-urgent messages can be sent to your provider as well.   To learn more about what you can do with MyChart, go to [email or web address].    Your next appointment:   After the echo   The format for your next appointment:   In Person  Provider:   You may see Dr. Azucena Cecil or one of the following  Advanced Practice Providers on your designated Care Team:    Nicolasa Ducking, NP  Eula Listen, PA-C  Marisue Ivan, PA-C  Cadence Clayton, [place]  Gillian Shields, NP    Other Instructions  Echocardiogram An echocardiogram is a procedure that uses painless sound waves (ultrasound) to produce an image of the heart. Images from an echocardiogram can provide important information about:  Signs of coronary artery disease (CAD).  Aneurysm detection. An aneurysm is a weak or damaged part of an artery wall that bulges out from the normal force of blood pumping through the body.  Heart size and shape. Changes in the size or shape of the heart can be associated with certain conditions, including heart failure, aneurysm, and CAD.  Heart muscle function.  Heart valve function.  Signs of a past heart attack.  Fluid buildup around the heart.  Thickening of the heart muscle.  A tumor or infectious growth around the heart valves. Tell a health care provider about:  Any allergies you have.  All medicines you are taking, including vitamins, herbs, eye drops, creams, and over-the-counter medicines.  Any blood disorders you have.  Any surgeries you have had.  Any medical conditions you have.  Whether you are pregnant or may be pregnant. What are the risks? Generally, this is a safe procedure. However, problems may occur, including:  Allergic reaction to dye (contrast) that may be used during the procedure. What happens before the procedure? No specific preparation is needed. You may eat and drink normally. What happens during the procedure?   An IV tube may be inserted into one of your veins.  You may receive image enhancer through this tube. An image enhancer is an injection that improves the quality of the pictures from your heart.  A gel will be applied to your chest.  A wand-like tool (transducer) will be moved over your chest. The gel will help to transmit the sound  waves from the transducer.  The sound waves will harmlessly bounce off of your heart to allow the heart images to be captured in real-time motion. The images will be recorded on a computer. The procedure may vary among health care providers and hospitals. What happens after the procedure?  You may return to  your normal, everyday life, including diet, activities, and medicines, unless your health care provider tells you not to do that. Summary  An echocardiogram is a procedure that uses painless sound waves (ultrasound) to produce an image of the heart.  Images from an echocardiogram can provide important information about the size and shape of your heart, heart muscle function, heart valve function, and fluid buildup around your heart.  You do not need to do anything to prepare before this procedure. You may eat and drink normally.  After the echocardiogram is completed, you may return to your normal, everyday life, unless your health care provider tells you not to do that. This information is not intended to replace advice given to you by your health care provider. Make sure you discuss any questions you have with your health care provider. Document Revised: 03/12/2019 Document Reviewed: 12/22/2016 Elsevier Patient Education  2020 [employer]      Signed, Debbe OdeaBrian Agbor-Etang, MD  11/11/2020 4:42 PM    [facility]

## 2020-12-16 ENCOUNTER — Other Ambulatory Visit: Payer: Medicaid Other

## 2020-12-23 ENCOUNTER — Ambulatory Visit: Payer: Medicaid Other | Admitting: Cardiology

## 2021-01-13 ENCOUNTER — Ambulatory Visit
Admission: RE | Admit: 2021-01-13 | Discharge: 2021-01-13 | Disposition: A | Discharge status: Home / Self Care | Payer: Medicaid Other | Source: Ambulatory Visit

## 2021-01-13 ENCOUNTER — Other Ambulatory Visit: Payer: Self-pay

## 2021-01-13 VITALS — BP 114/63 | HR 77 | Temp 98.4°F | Resp 18 | Ht 64.0 in | Wt 128.0 lb

## 2021-01-13 DIAGNOSIS — U071 COVID-19: Secondary | ICD-10-CM

## 2021-01-13 NOTE — ED Triage Notes (Signed)
Pt c/o nasal congestion, chest congestion, cough and chest tightness with recent COVID positive test (01/10/21, at-home, pt reports 3 negatives and then 1 positive). Pt denies f/n/v/d or other symptoms. Pt is currently [redacted] weeks pregnant.

## 2021-01-13 NOTE — ED Provider Notes (Signed)
[facility]    CSN: [id number] Arrival date & time: 01/13/21  0945      History   Chief Complaint Chief Complaint  Patient presents with  . Cough  . Covid Positive    01/10/21    HPI Jenny Grant is a 20 y.o. female.   HPI   20 year old female here for evaluation of nasal congestion, chest congestion, chest tightness, and cough.  Patient reports that her symptoms started on 01/10/2021.  That day she took a home Covid test and was positive.  Patient denies fever, wheezing, vomiting, or diarrhea.  Patient has associated symptoms of a mild runny nose, sore throat which is improving, headache which has resolved, and nausea.  Patient is currently [redacted] weeks pregnant and is under the care of an obstetrician who directed her to come to urgent care to rule out pneumonia.  Past Medical History:  Diagnosis Date  . Depression   . Headache(784.0)   . Migraines     Patient Active Problem List   Diagnosis Date Noted  . Migraine without aura and without status migrainosus, not intractable 05/12/2014  . Tension type headache 05/12/2014  . Anxiety state, unspecified 05/12/2014  . Depressed mood 05/12/2014    Past Surgical History:  Procedure Laterality Date  . WISDOM TOOTH EXTRACTION      OB History    Gravida  1   Para      Term      Preterm      AB      Living        SAB      IAB      Ectopic      Multiple      Live Births               Home Medications    Prior to Admission medications   Medication Sig Start Date End Date Taking? Authorizing Provider  Prenatal Multivit-Min-Fe-FA (PRENATAL, W/IRON & FA,) 27-0.8 MG TABS Take 1 tablet by mouth daily.    [provider]  Norgestimate-Ethinyl Estradiol Triphasic (TRI-PREVIFEM) 0.18/0.215/0.25 MG-35 MCG tablet Take 1 tablet by mouth at bedtime.   10/28/20  [provider]    Family History Family History  Problem Relation Age of Onset  . Cerebral palsy Brother         1 brother has CP  . Seizures Brother   . Lung cancer Maternal Grandmother   . Lung cancer Maternal Grandfather   . Cancer Paternal Grandfather     Social History Social History   Tobacco Use  . Smoking status: Never Smoker  . Smokeless tobacco: Never Used  Vaping Use  . Vaping Use: Every day  Substance Use Topics  . Alcohol use: Yes    Comment: occasionally  . Drug use: Yes    Types: Marijuana     Allergies   Patient has no known allergies.   Review of Systems Review of Systems  Constitutional: Negative for activity change, appetite change and fever.  HENT: Positive for congestion, rhinorrhea and sore throat. Negative for ear pain.   Respiratory: Positive for cough and chest tightness. Negative for wheezing.   Gastrointestinal: Positive for nausea. Negative for diarrhea and vomiting.  Musculoskeletal: Negative for arthralgias.  Skin: Negative for rash.  Neurological: Positive for headaches.  Hematological: Negative.   Psychiatric/Behavioral: Negative.      Physical Exam Triage Vital Signs ED Triage Vitals  Enc Vitals Group     BP 01/13/21 1001  114/63     Pulse Rate 01/13/21 1001 77     Resp 01/13/21 1001 18     Temp 01/13/21 1001 98.4 F (36.9 C)     Temp Source 01/13/21 1001 Oral     SpO2 01/13/21 1001 100 %     Weight 01/13/21 0959 128 lb (58.1 kg)     Height 01/13/21 0959 _0  (1.626 m)     Head Circumference --      Peak Flow --      Pain Score 01/13/21 0959 0     Pain Loc --      Pain Edu? --      Excl. in [place]? --    No data found.  Updated Vital Signs BP 114/63 (BP Location: Left Arm)   Pulse 77   Temp 98.4 F (36.9 C) (Oral)   Resp 18   Ht _1  (1.626 m)   Wt 128 lb (58.1 kg)   LMP 10/14/2020 (Approximate)   SpO2 100%   BMI 21.97 kg/m   Visual Acuity Right Eye Distance:   Left Eye Distance:   Bilateral Distance:    Right Eye Near:   Left Eye Near:    Bilateral Near:     Physical Exam Vitals and nursing note reviewed.   Constitutional:      General: She is not in acute distress.    Appearance: Normal appearance. She is not ill-appearing.  HENT:     Head: Normocephalic and atraumatic.     Right Ear: Tympanic membrane, ear canal and external ear normal.     Left Ear: Tympanic membrane, ear canal and external ear normal.     Nose: Congestion and rhinorrhea present.     Comments: His mucosa is erythematous and edematous with clear nasal discharge.    Mouth/Throat:     Mouth: Mucous membranes are moist.     Pharynx: Oropharynx is clear. No oropharyngeal exudate or posterior oropharyngeal erythema.  Neck:     Comments: Patient has mild, anterior, shotty cervical lymphadenopathy bilaterally. Cardiovascular:     Rate and Rhythm: Normal rate and regular rhythm.     Pulses: Normal pulses.     Heart sounds: Normal heart sounds. No murmur heard. No gallop.   Pulmonary:     Effort: Pulmonary effort is normal.     Breath sounds: Normal breath sounds. No wheezing, rhonchi or rales.  Musculoskeletal:     Cervical back: Normal range of motion and neck supple.  Lymphadenopathy:     Cervical: Cervical adenopathy present.  Skin:    General: Skin is warm and dry.     Capillary Refill: Capillary refill takes less than 2 seconds.     Findings: No erythema or rash.  Neurological:     General: No focal deficit present.     Mental Status: She is alert and oriented to person, place, and time.  Psychiatric:        Mood and Affect: Mood normal.        Behavior: Behavior normal.        Thought Content: Thought content normal.        Judgment: Judgment normal.      UC Treatments / Results  Labs (all labs ordered are listed, but only abnormal results are displayed) Labs Reviewed - No data to display  EKG   Radiology No results found.  Procedures Procedures (including critical care time)  Medications Ordered in UC Medications - No data to display  Initial Impression /  Assessment and Plan / UC Course  I  have reviewed the triage vital signs and the nursing notes.  Pertinent labs & imaging results that were available during my care of the patient were reviewed by me and considered in my medical decision making (see chart for details).   Is a very pleasant 20 year old female who was sent in by her obstetrician for evaluation and to rule out pneumonia.  Patient is [redacted] weeks pregnant and tested +3 days ago for COVID on at home test.  Patient is nontoxic in appearance and does not demonstrate any tachypnea or respiratory distress.  Physical exam does reveal erythematous and edematous nasal mucosa with clear discharge.  Posterior oropharynx is unremarkable.  Patient does have bilateral anterior cervical lymphadenopathy.  Lung sounds are clear to auscultation in all fields.  Will discharge patient home with supportive care and have her follow-up with her obstetrician for any new or worsening symptoms.  ER precautions given.   Final Clinical Impressions(s) / UC Diagnoses   Final diagnoses:  [id number]     Discharge Instructions     You need to continue isolating at home for another 3 days.  You can break quarantine after 5 days from your positive test if your symptoms have improved and you have not had a fever for 24 hours.  Continue use Tylenol as needed for fever and pain.  You can use plain Robitussin cough syrup as needed for cough.  You may use a NeilMed sinus rinse kit and distilled water to help alleviate your nasal congestion.  Do not use tap water.  If you develop shortness of breath-especially at rest, you cannot catch her breath at rest, you cannot speak in full sentences, or is a late sign your lip start turning blue you need to go to the ER for evaluation.    ED Prescriptions    None     PDMP not reviewed this encounter.   Margarette Canada, NP 01/13/21 1032

## 2021-01-13 NOTE — Discharge Instructions (Signed)
You need to continue isolating at home for another 3 days.  You can break quarantine after 5 days from your positive test if your symptoms have improved and you have not had a fever for 24 hours.  Continue use Tylenol as needed for fever and pain.  You can use plain Robitussin cough syrup as needed for cough.  You may use a NeilMed sinus rinse kit and distilled water to help alleviate your nasal congestion.  Do not use tap water.  If you develop shortness of breath-especially at rest, you cannot catch her breath at rest, you cannot speak in full sentences, or is a late sign your lip start turning blue you need to go to the ER for evaluation.

## 2021-02-06 LAB — OB RESULTS CONSOLE RUBELLA ANTIBODY, IGM: Rubella: NON-IMMUNE/NOT IMMUNE

## 2021-02-06 LAB — OB RESULTS CONSOLE VARICELLA ZOSTER ANTIBODY, IGG: Varicella: NON-IMMUNE/NOT IMMUNE

## 2021-02-06 LAB — OB RESULTS CONSOLE HEPATITIS B SURFACE ANTIGEN: Hepatitis B Surface Ag: NEGATIVE

## 2021-03-15 ENCOUNTER — Telehealth: Payer: Self-pay | Admitting: Licensed Clinical Social Worker

## 2021-03-15 NOTE — Telephone Encounter (Signed)
From [name] "Jenny Grant this member requests an appointment with you. She reports a history of Anxiety, She reports that she had a panic attack in December 2021 and was seen in Urgent Care and given meds, Member reports anxious thoughts currently. Please accept this as her referral. Thanks"

## 2021-03-22 ENCOUNTER — Ambulatory Visit
Admission: RE | Admit: 2021-03-22 | Discharge: 2021-03-22 | Disposition: A | Discharge status: Home / Self Care | Payer: Medicaid Other | Source: Ambulatory Visit | Attending: Family Medicine | Admitting: Family Medicine

## 2021-03-22 ENCOUNTER — Other Ambulatory Visit: Payer: Self-pay

## 2021-03-22 VITALS — BP 114/61 | HR 79 | Temp 98.4°F | Resp 18 | Ht 64.0 in | Wt 150.0 lb

## 2021-03-22 DIAGNOSIS — J029 Acute pharyngitis, unspecified: Secondary | ICD-10-CM

## 2021-03-22 DIAGNOSIS — R0981 Nasal congestion: Secondary | ICD-10-CM | POA: Diagnosis not present

## 2021-03-22 DIAGNOSIS — R519 Headache, unspecified: Secondary | ICD-10-CM | POA: Diagnosis not present

## 2021-03-22 LAB — GROUP A STREP BY PCR: Group A Strep by PCR: NOT DETECTED

## 2021-03-22 MED ORDER — LIDOCAINE VISCOUS HCL 2 % MT SOLN: 15.0000 mL | OROMUCOSAL | 0 refills | Status: AC | PRN

## 2021-03-22 NOTE — ED Triage Notes (Signed)
Pt c/o sore throat, headache that started yesterday morning. Pt states had covid in February according to at home test

## 2021-03-22 NOTE — Discharge Instructions (Signed)
Strep test is negative.  This is likely a viral illness.  Increase rest and fluids at this time.  I have sent viscous lidocaine that he can use to swish around and then spit to help numb your throat.  You can also use over-the-counter Chloraseptic spray.  Can take Tylenol for pain relief and continue with the Claritin.  Please follow-up for any worsening symptoms or if you are not better after 10 days.

## 2021-03-22 NOTE — ED Provider Notes (Signed)
[facility]    CSN: [id number] Arrival date & time: 03/22/21  1746      History   Chief Complaint Chief Complaint  Patient presents with  . Sore Throat    HPI Jenny Grant is a 20 y.o. female presenting for 2-day history of sore throat, painful swallowing, headaches and nasal congestion.  Denies any fever, fatigue, body aches, cough, chest pain, breathing difficulty, nausea/vomiting or diarrhea.  No sick contacts.  No known exposure to COVID-19.  Admits to personal history of COVID-19 in February 2022.  Patient is taking Claritin and Tylenol for symptoms as the Tylenol has helped a little.  Patient says that she did go to church on Sunday and thinks she might of been around some sick people at that time possibly.  She is currently [redacted] weeks pregnant.  She has no other concerns.  HPI  Past Medical History:  Diagnosis Date  . Depression   . Headache(784.0)   . Migraines     Patient Active Problem List   Diagnosis Date Noted  . Migraine without aura and without status migrainosus, not intractable 05/12/2014  . Tension type headache 05/12/2014  . Anxiety state, unspecified 05/12/2014  . Depressed mood 05/12/2014    Past Surgical History:  Procedure Laterality Date  . WISDOM TOOTH EXTRACTION      OB History    Gravida  1   Para      Term      Preterm      AB      Living        SAB      IAB      Ectopic      Multiple      Live Births               Home Medications    Prior to Admission medications   Medication Sig Start Date End Date Taking? Authorizing Provider  lidocaine (XYLOCAINE) 2 % solution Use as directed 15 mLs in the mouth or throat as needed for up to 5 days for mouth pain (swish and spit). 03/22/21 03/27/21 Yes Shirlee Latch, PA-C  Prenatal Multivit-Min-Fe-FA (PRENATAL, W/IRON & FA,) 27-0.8 MG TABS Take 1 tablet by mouth daily.   Yes [provider]  Norgestimate-Ethinyl Estradiol Triphasic (TRI-PREVIFEM)  0.18/0.215/0.25 MG-35 MCG tablet Take 1 tablet by mouth at bedtime.   10/28/20  [provider]    Family History Family History  Problem Relation Age of Onset  . Cerebral palsy Brother        1 brother has CP  . Seizures Brother   . Lung cancer Maternal Grandmother   . Lung cancer Maternal Grandfather   . Cancer Paternal Grandfather     Social History Social History   Tobacco Use  . Smoking status: Never Smoker  . Smokeless tobacco: Never Used  Vaping Use  . Vaping Use: Every day  Substance Use Topics  . Alcohol use: Yes    Comment: occasionally  . Drug use: Yes    Types: Marijuana     Allergies   Patient has no known allergies.   Review of Systems Review of Systems  Constitutional: Negative for chills, diaphoresis, fatigue and fever.  HENT: Positive for congestion and sore throat. Negative for ear pain, rhinorrhea, sinus pressure and sinus pain.   Respiratory: Negative for cough and shortness of breath.   Gastrointestinal: Negative for abdominal pain, nausea and vomiting.  Musculoskeletal: Negative for arthralgias and myalgias.  Skin: Negative for rash.  Neurological: Positive for headaches. Negative for weakness.  Hematological: Negative for adenopathy.     Physical Exam Triage Vital Signs ED Triage Vitals  Enc Vitals Group     BP 03/22/21 1755 114/61     Pulse Rate 03/22/21 1755 79     Resp 03/22/21 1755 18     Temp 03/22/21 1755 98.4 F (36.9 C)     Temp Source 03/22/21 1755 Oral     SpO2 03/22/21 1755 98 %     Weight 03/22/21 1753 150 lb (68 kg)     Height 03/22/21 1753 5\' 4"  (1.626 m)     Head Circumference --      Peak Flow --      Pain Score 03/22/21 1753 6     Pain Loc --      Pain Edu? --      Excl. in GC? --    No data found.  Updated Vital Signs BP 114/61 (BP Location: Left Arm)   Pulse 79   Temp 98.4 F (36.9 C) (Oral)   Resp 18   Ht 5\' 4"  (1.626 m)   Wt 150 lb (68 kg)   LMP 10/14/2020 (Approximate)   SpO2 98%   BMI  25.75 kg/m       Physical Exam Vitals and nursing note reviewed.  Constitutional:      General: She is not in acute distress.    Appearance: Normal appearance. She is not ill-appearing or toxic-appearing.  HENT:     Head: Normocephalic and atraumatic.     Nose: Congestion and rhinorrhea present.     Mouth/Throat:     Mouth: Mucous membranes are moist.     Pharynx: Oropharynx is clear. Posterior oropharyngeal erythema present.  Eyes:     General: No scleral icterus.       Right eye: No discharge.        Left eye: No discharge.     Conjunctiva/sclera: Conjunctivae normal.  Cardiovascular:     Rate and Rhythm: Normal rate and regular rhythm.     Heart sounds: Normal heart sounds.  Pulmonary:     Effort: Pulmonary effort is normal. No respiratory distress.     Breath sounds: Normal breath sounds.  Musculoskeletal:     Cervical back: Neck supple.  Skin:    General: Skin is dry.  Neurological:     General: No focal deficit present.     Mental Status: She is alert. Mental status is at baseline.     Motor: No weakness.     Gait: Gait normal.  Psychiatric:        Mood and Affect: Mood normal.        Behavior: Behavior normal.        Thought Content: Thought content normal.      UC Treatments / Results  Labs (all labs ordered are listed, but only abnormal results are displayed) Labs Reviewed  GROUP A STREP BY PCR    EKG   Radiology No results found.  Procedures Procedures (including critical care time)  Medications Ordered in UC Medications - No data to display  Initial Impression / Assessment and Plan / UC Course  I have reviewed the triage vital signs and the nursing notes.  Pertinent labs & imaging results that were available during my care of the patient were reviewed by me and considered in my medical decision making (see chart for details).   20 year old female presenting for sore throat, congestion  and headaches x2 days.  Vital signs are normal and  stable in clinic.  Exam is reassuring.  She does have some minor nasal congestion and clear rhinorrhea as well as mild posterior pharyngeal erythema.  Chest is clear to auscultation heart regular rate and rhythm.  Molecular strep test is negative.  Patient has history of COVID-19 in February 2022.  Not concerned for reinfection at this time and good possibility she could test positive still.  Advised this is likely a viral illness and supportive care encouraged with increasing rest and fluids.  Advised to continue with Claritin and Tylenol.  I have sent viscous lidocaine for as well.  Advised to follow-up with our department or PCP for any worsening symptoms or if not better after 10 days.   Final Clinical Impressions(s) / UC Diagnoses   Final diagnoses:  Viral pharyngitis  Nasal congestion  Acute nonintractable headache, unspecified headache type     Discharge Instructions     Strep test is negative.  This is likely a viral illness.  Increase rest and fluids at this time.  I have sent viscous lidocaine that he can use to swish around and then spit to help numb your throat.  You can also use over-the-counter Chloraseptic spray.  Can take Tylenol for pain relief and continue with the Claritin.  Please follow-up for any worsening symptoms or if you are not better after 10 days.    ED Prescriptions    Medication Sig Dispense Auth. Provider   lidocaine (XYLOCAINE) 2 % solution Use as directed 15 mLs in the mouth or throat as needed for up to 5 days for mouth pain (swish and spit). 100 mL Shirlee Latch, PA-C     PDMP not reviewed this encounter.   Shirlee Latch, PA-C 03/22/21 [phone]

## 2021-06-25 IMAGING — CR DG CHEST 2V
3 series · 3 of 3 positions shown · non-contrast
Comparison: None.

CLINICAL DATA: 19-year-old female with chest pain.

EXAM:
CHEST - 2 VIEW

[chest pa]
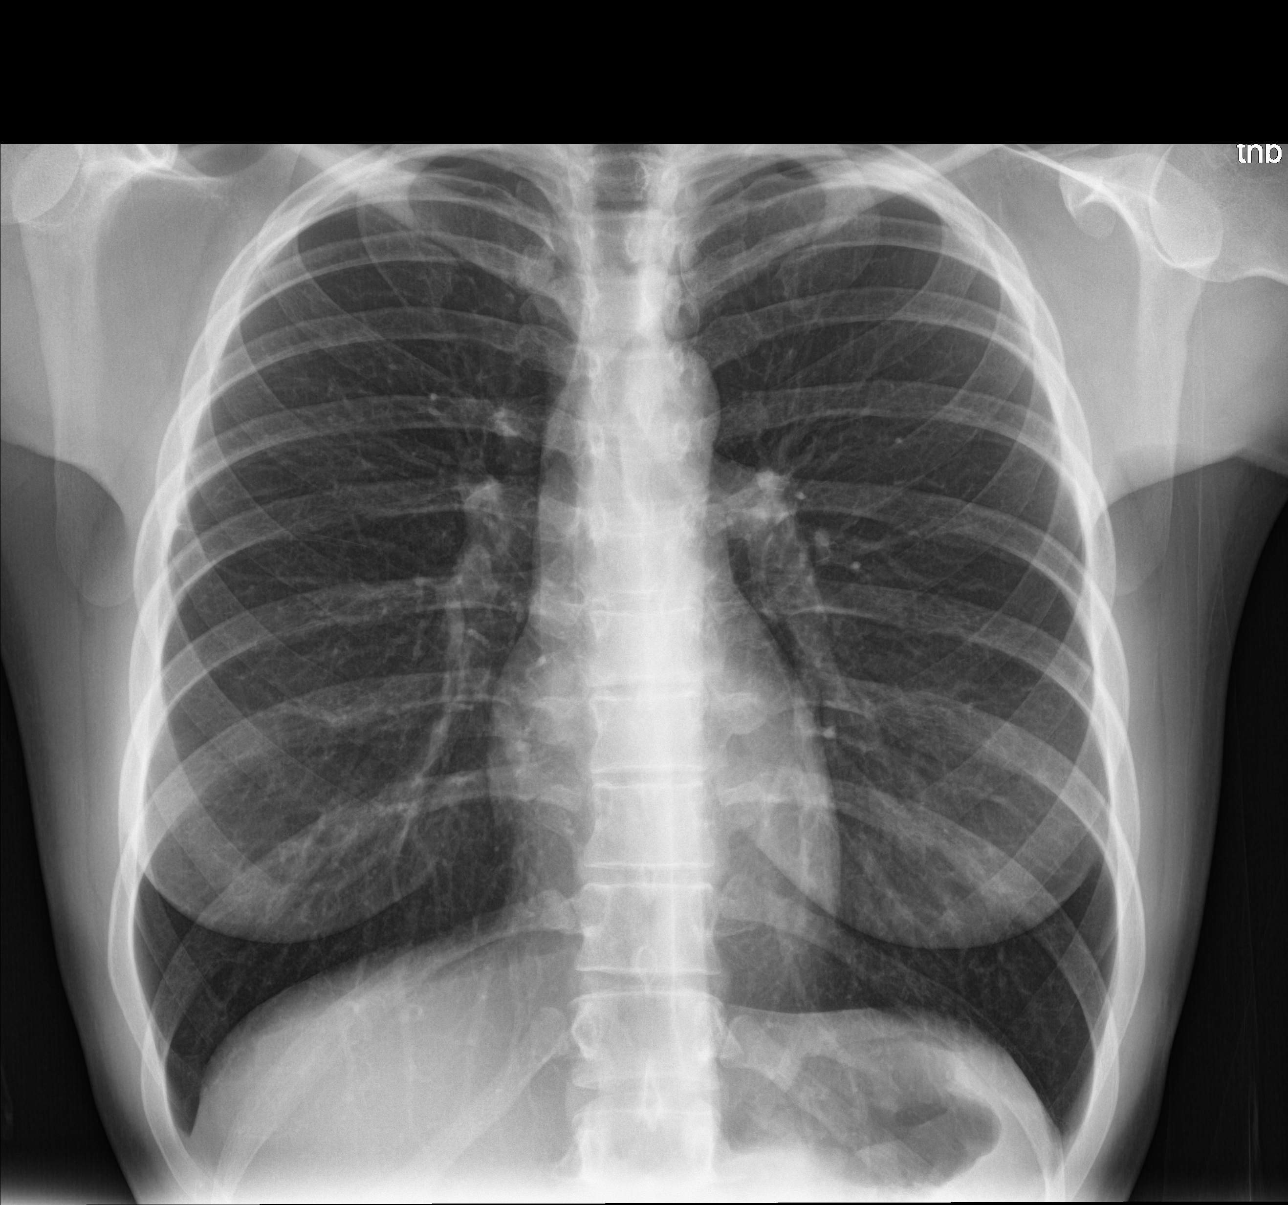

[chest lat (1 of 2)]
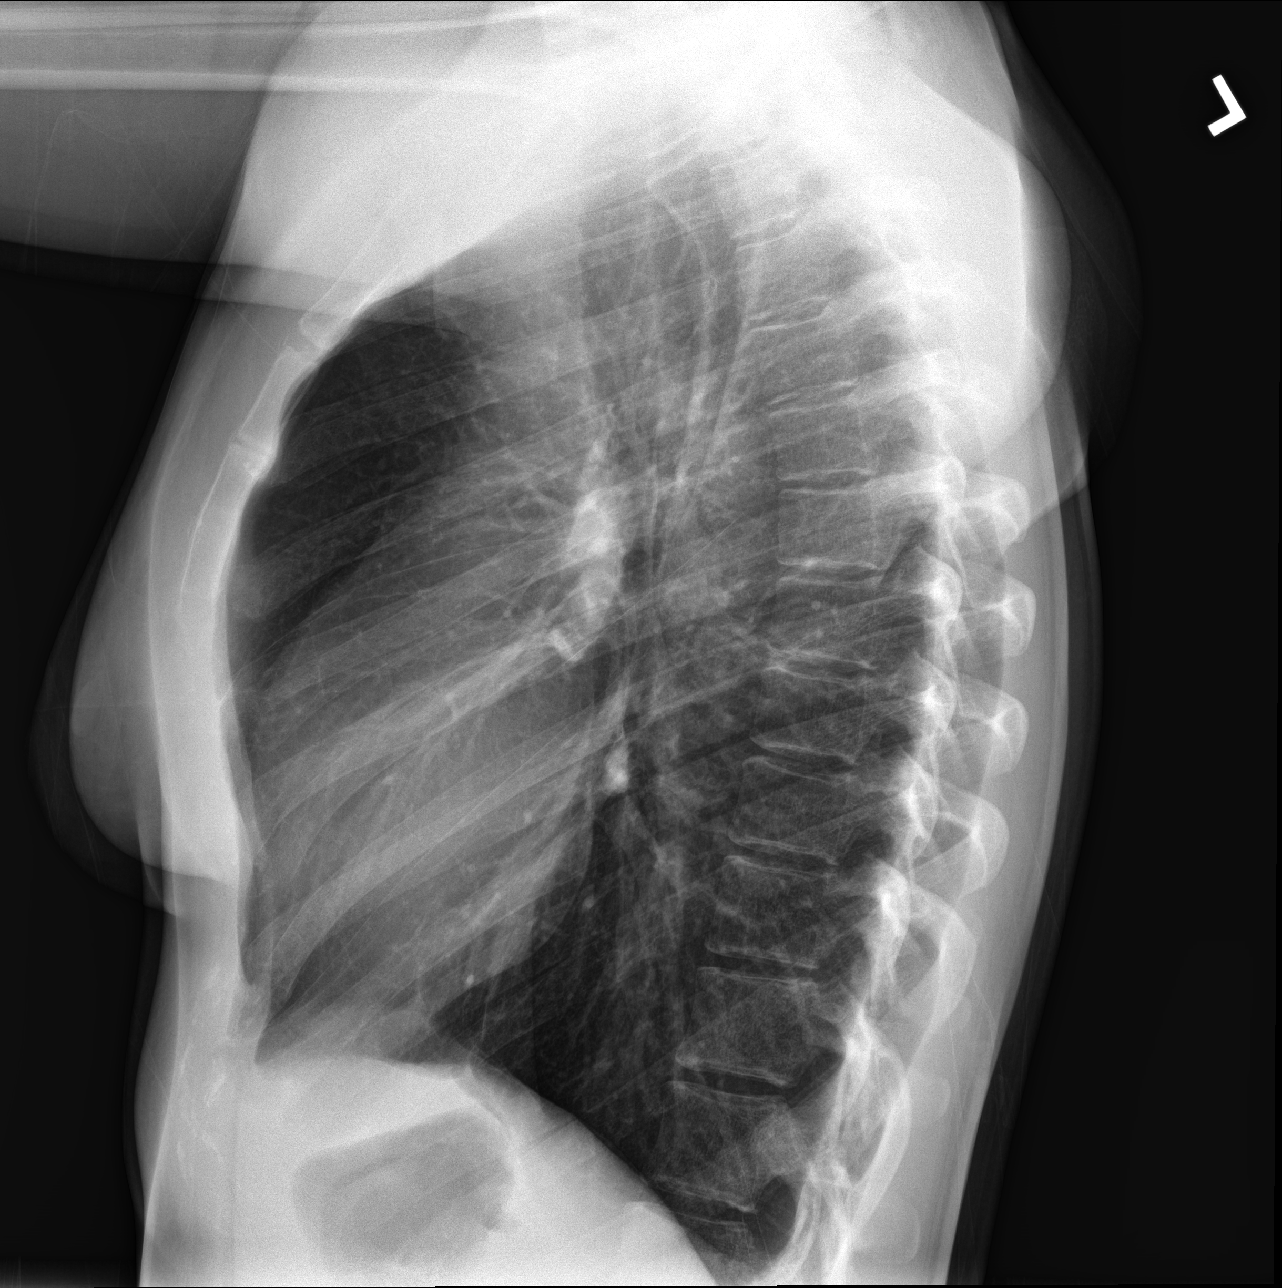

[chest lat (2 of 2)]
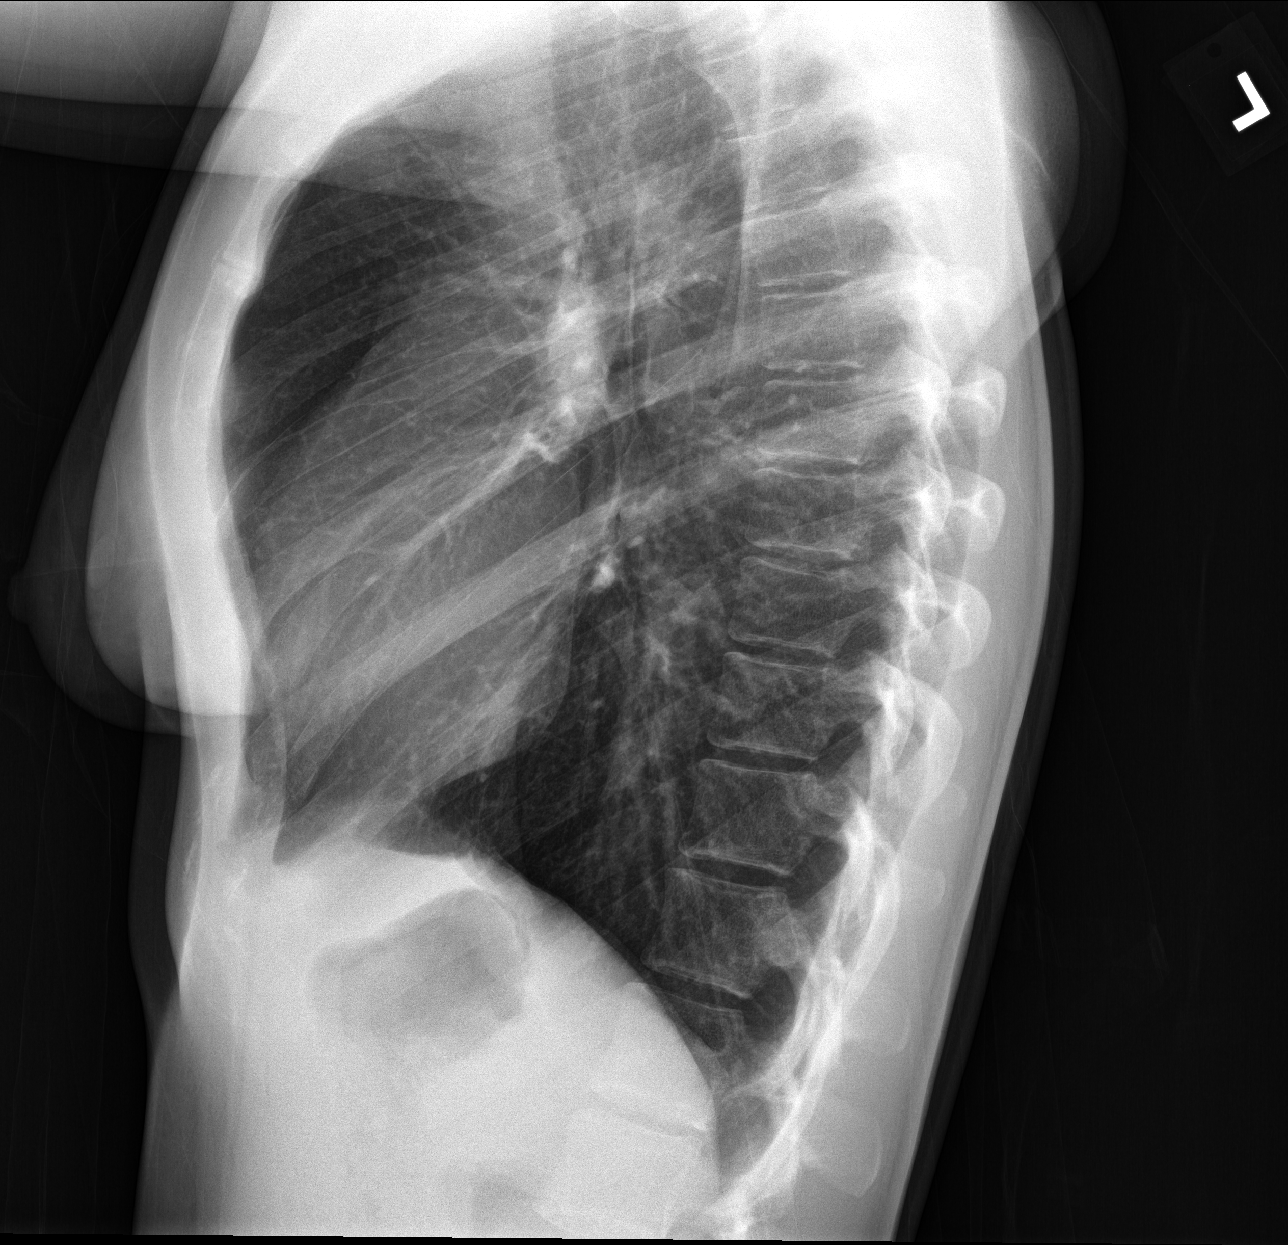

[3 of 3 positions shown; findings below may reference images not displayed]

FINDINGS: The heart size and mediastinal contours are within normal limits.
Both lungs are clear. The visualized skeletal structures are
unremarkable.
IMPRESSION: No active cardiopulmonary disease.

## 2021-07-27 LAB — OB RESULTS CONSOLE GC/CHLAMYDIA
Chlamydia: NEGATIVE
Gonorrhea: NEGATIVE

## 2021-07-27 LAB — OB RESULTS CONSOLE GBS: GBS: NEGATIVE

## 2021-07-27 LAB — OB RESULTS CONSOLE HIV ANTIBODY (ROUTINE TESTING): HIV: NONREACTIVE

## 2021-07-27 LAB — OB RESULTS CONSOLE RPR: RPR: NONREACTIVE

## 2021-08-18 ENCOUNTER — Other Ambulatory Visit: Payer: Self-pay | Admitting: Obstetrics and Gynecology

## 2021-08-18 ENCOUNTER — Observation Stay
Admission: EM | Admit: 2021-08-18 | Discharge: 2021-08-18 | Disposition: A | Discharge status: Home / Self Care | Payer: Medicaid Other | Source: Home / Self Care | Admitting: Obstetrics and Gynecology

## 2021-08-18 ENCOUNTER — Other Ambulatory Visit: Payer: Self-pay

## 2021-08-18 ENCOUNTER — Encounter: Payer: Self-pay | Admitting: Obstetrics and Gynecology

## 2021-08-18 DIAGNOSIS — O4193X0 Disorder of amniotic fluid and membranes, unspecified, third trimester, not applicable or unspecified: Secondary | ICD-10-CM | POA: Insufficient documentation

## 2021-08-18 DIAGNOSIS — U071 COVID-19: Secondary | ICD-10-CM | POA: Insufficient documentation

## 2021-08-18 DIAGNOSIS — Z3A39 39 weeks gestation of pregnancy: Secondary | ICD-10-CM | POA: Insufficient documentation

## 2021-08-18 DIAGNOSIS — O98513 Other viral diseases complicating pregnancy, third trimester: Secondary | ICD-10-CM | POA: Insufficient documentation

## 2021-08-18 DIAGNOSIS — O4290 Premature rupture of membranes, unspecified as to length of time between rupture and onset of labor, unspecified weeks of gestation: Secondary | ICD-10-CM | POA: Diagnosis present

## 2021-08-18 HISTORY — DX: Anxiety disorder, unspecified: F41.9

## 2021-08-18 LAB — WET PREP, GENITAL
Sperm: NONE SEEN
Trich, Wet Prep: NONE SEEN
Yeast Wet Prep HPF POC: NONE SEEN

## 2021-08-18 LAB — RUPTURE OF MEMBRANE (ROM)PLUS: Rom Plus: NEGATIVE

## 2021-08-18 MED ORDER — METRONIDAZOLE 500 MG PO TABS: 500.0000 mg | ORAL_TABLET | Freq: Two times a day (BID) | ORAL | 0 refills | Status: DC

## 2021-08-18 NOTE — OB Triage Note (Signed)
Pt. reports to Labor & Delivery for complaints of leaking of fluid this afternoon. She reports that she had a small amount of wet fluid in her underwear upon going to the bathroom that smelled somewhat like "maple syrup," but was unable to note color of fluid due to the color of her underwear. She states that the fluid did not smell like urine. She denies vaginal bleeding, decreased fetal movement, or noticeable contractions; she does note intermittent "tightening" and "pressure" in her lower abdomen. Pt. presents with a headache today, rated 4/10; pt. has a history of migraines, and notes that this headache seems allergy-like to her. External [place] and Toco applied. Initial FHR 148bpm. Vital signs WNL. Per CNM Oxley's order, wet prep and ROM Plus performed and sent to lab. Will continue to monitor.

## 2021-08-18 NOTE — Discharge Instructions (Signed)
Return to Labor & Delivery on Tuesday, September 27th at 0001 for your Induction. Come to the Emergency Room Entrance no sooner than 00:01 (you will be charged for two days if you come before midnight).

## 2021-08-18 NOTE — Discharge Summary (Signed)
Patient ID: Jenny Grant MRN: [id number] DOB/AGE: March 03, 2001 20 y.o.  Admit date: 08/18/2021 Discharge date: 08/18/2021  Admission Diagnoses: 20yo G2P0 at [redacted]w[redacted]d presents with c/o leaking fluid  Discharge Diagnoses: BV  Factors complicating pregnancy: 1. History depression/anxiety 2. COVID during pregnancy 3. Varicella and rubella non-immune  Prenatal Procedures: NST  Consults: None  Significant Diagnostic Studies:  Results for orders placed or performed during the hospital encounter of 08/18/21 (from the past 168 hour(s))  Wet prep, genital   Collection Time: 08/18/21  2:51 PM   Specimen: Vaginal  Result Value Ref Range   Yeast Wet Prep HPF POC NONE SEEN NONE SEEN   Trich, Wet Prep NONE SEEN NONE SEEN   Clue Cells Wet Prep HPF POC PRESENT (A) NONE SEEN   WBC, Wet Prep HPF POC PRESENT (A) NONE SEEN   Sperm NONE SEEN   ROM Plus ([facility] only)   Collection Time: 08/18/21  2:51 PM  Result Value Ref Range   Rom Plus NEGATIVE     Treatments: None  Hospital Course:  This is a 20 y.o. G2P0010 with IUP at [redacted]w[redacted]d seen for possible leaking fluid.  No leaking of fluid and no bleeding.  Labs showed that she was not SROM'd however she has BV.  She was observed, fetal heart rate monitoring remained reassuring, and she had no signs/symptoms of labor or other maternal-fetal concerns.  She was deemed stable for discharge to home with outpatient follow up.  Discharge Physical Exam:  BP 131/73 (BP Location: Right Arm)   Pulse 95   Temp 98.5 F (36.9 C) (Oral)   Resp 18   LMP 11/12/2020   General: NAD CV: RRR Pulm: CTABL, nl effort ABD: s/nd/nt, gravid DVT Evaluation: LE non-ttp, no evidence of DVT on exam.  NST: FHR baseline: 150 bpm Variability: moderate Accelerations: yes Decelerations: none Category/reactivity: reactive  TOCO: quiet SVE: deferred      Discharge Condition: Stable  Disposition:    Allergies as of 08/18/2021   No Known Allergies       Medication List     TAKE these medications    acetaminophen 500 MG tablet Commonly known as: TYLENOL Take 1,000 mg by mouth every 6 (six) hours as needed.   metroNIDAZOLE 500 MG tablet Commonly known as: Flagyl Take 1 tablet (500 mg total) by mouth 2 (two) times daily for 7 days.   Prenatal (w/Iron & FA) 27-0.8 MG Tabs Take 1 tablet by mouth daily.         SignedHaroldine Laws, CNM 08/18/2021 5:06 PM

## 2021-08-19 ENCOUNTER — Encounter: Payer: Self-pay | Admitting: Obstetrics and Gynecology

## 2021-08-19 ENCOUNTER — Inpatient Hospital Stay
Admission: EM | Admit: 2021-08-19 | Discharge: 2021-08-23 | DRG: 787 | Disposition: A | Discharge status: Home / Self Care | Payer: Medicaid Other | Attending: Obstetrics and Gynecology | Admitting: Obstetrics and Gynecology

## 2021-08-19 ENCOUNTER — Other Ambulatory Visit: Payer: Self-pay

## 2021-08-19 DIAGNOSIS — D62 Acute posthemorrhagic anemia: Secondary | ICD-10-CM | POA: Diagnosis not present

## 2021-08-19 DIAGNOSIS — O4290 Premature rupture of membranes, unspecified as to length of time between rupture and onset of labor, unspecified weeks of gestation: Secondary | ICD-10-CM | POA: Diagnosis present

## 2021-08-19 DIAGNOSIS — Z8616 Personal history of COVID-19: Secondary | ICD-10-CM | POA: Diagnosis not present

## 2021-08-19 DIAGNOSIS — Z23 Encounter for immunization: Secondary | ICD-10-CM

## 2021-08-19 DIAGNOSIS — Z20822 Contact with and (suspected) exposure to covid-19: Secondary | ICD-10-CM | POA: Diagnosis present

## 2021-08-19 DIAGNOSIS — Z3A40 40 weeks gestation of pregnancy: Secondary | ICD-10-CM | POA: Diagnosis not present

## 2021-08-19 DIAGNOSIS — O4292 Full-term premature rupture of membranes, unspecified as to length of time between rupture and onset of labor: Secondary | ICD-10-CM | POA: Diagnosis present

## 2021-08-19 DIAGNOSIS — O9081 Anemia of the puerperium: Secondary | ICD-10-CM | POA: Diagnosis not present

## 2021-08-19 DIAGNOSIS — O26893 Other specified pregnancy related conditions, third trimester: Secondary | ICD-10-CM | POA: Diagnosis present

## 2021-08-19 DIAGNOSIS — Z3490 Encounter for supervision of normal pregnancy, unspecified, unspecified trimester: Secondary | ICD-10-CM

## 2021-08-19 LAB — RESP PANEL BY RT-PCR (FLU A&B, COVID) ARPGX2
Influenza A by PCR: NEGATIVE
Influenza B by PCR: NEGATIVE
SARS Coronavirus 2 by RT PCR: NEGATIVE

## 2021-08-19 LAB — CBC
HCT: 39.6 % (ref 36.0–46.0)
Hemoglobin: 13.8 g/dL (ref 12.0–15.0)
MCH: 31.4 pg (ref 26.0–34.0)
MCHC: 34.8 g/dL (ref 30.0–36.0)
MCV: 90 fL (ref 80.0–100.0)
Platelets: 250 10*3/uL (ref 150–400)
RBC: 4.4 MIL/uL (ref 3.87–5.11)
RDW: 14.7 % (ref 11.5–15.5)
WBC: 14.4 10*3/uL — ABNORMAL HIGH (ref 4.0–10.5)
nRBC: 0 % (ref 0.0–0.2)

## 2021-08-19 LAB — TYPE AND SCREEN
ABO/RH(D): A POS
Antibody Screen: NEGATIVE

## 2021-08-19 LAB — ABO/RH: ABO/RH(D): A POS

## 2021-08-19 LAB — RUPTURE OF MEMBRANE (ROM)PLUS: Rom Plus: POSITIVE

## 2021-08-19 MED ORDER — OXYTOCIN-SODIUM CHLORIDE 30-0.9 UT/500ML-% IV SOLN
2.5000 [IU]/h | INTRAVENOUS | Status: DC
Start: 2021-08-19 — End: 2021-08-21
  Filled 2021-08-19: qty 500

## 2021-08-19 MED ORDER — SOD CITRATE-CITRIC ACID 500-334 MG/5ML PO SOLN: 30.0000 mL | ORAL | Status: DC | PRN

## 2021-08-19 MED ORDER — ACETAMINOPHEN 325 MG PO TABS: 650.0000 mg | ORAL_TABLET | ORAL | Status: DC | PRN

## 2021-08-19 MED ORDER — LACTATED RINGERS IV SOLN
500.0000 mL | INTRAVENOUS | Status: DC | PRN
  Administered 2021-08-20: 500 mL via INTRAVENOUS

## 2021-08-19 MED ORDER — OXYCODONE-ACETAMINOPHEN 5-325 MG PO TABS: 1.0000 | ORAL_TABLET | ORAL | Status: DC | PRN

## 2021-08-19 MED ORDER — ONDANSETRON HCL 4 MG/2ML IJ SOLN: 4.0000 mg | Freq: Four times a day (QID) | INTRAMUSCULAR | Status: DC | PRN

## 2021-08-19 MED ORDER — FENTANYL CITRATE (PF) 100 MCG/2ML IJ SOLN: 50.0000 ug | INTRAMUSCULAR | Status: DC | PRN

## 2021-08-19 MED ORDER — LIDOCAINE HCL (PF) 1 % IJ SOLN
30.0000 mL | INTRAMUSCULAR | Status: DC | PRN
Start: 2021-08-19 — End: 2021-08-21

## 2021-08-19 MED ORDER — OXYTOCIN BOLUS FROM INFUSION: 333.0000 mL | Freq: Once | INTRAVENOUS | Status: DC

## 2021-08-19 MED ORDER — LACTATED RINGERS IV SOLN: INTRAVENOUS | Status: DC

## 2021-08-19 MED ORDER — OXYCODONE-ACETAMINOPHEN 5-325 MG PO TABS: 2.0000 | ORAL_TABLET | ORAL | Status: DC | PRN

## 2021-08-19 NOTE — H&P (Signed)
OB History & Physical   History of Present Illness:  Chief Complaint:   HPI:  Jenny Grant is a 20 y.o. G2P0010 female at [redacted]w[redacted]d dated by LMP.  She presents to L&D for SROM.  She reports:  -active fetal movement -LOF/SROM at 1900 -no vaginal bleeding -onset of contractions currently every 2-6 minutes  Pregnancy Issues: 1. 1. History depression/anxiety 2. COVID during pregnancy 3. Varicella and rubella non-immune   Maternal Medical History:   Past Medical History:  Diagnosis Date   Anxiety    Depression    Headache(784.0)    Migraines     Past Surgical History:  Procedure Laterality Date   WISDOM TOOTH EXTRACTION      No Known Allergies  Prior to Admission medications   Medication Sig Start Date End Date Taking? Authorizing Provider  acetaminophen (TYLENOL) 500 MG tablet Take 1,000 mg by mouth every 6 (six) hours as needed.   Yes [provider]  metroNIDAZOLE (FLAGYL) 500 MG tablet Take 1 tablet (500 mg total) by mouth 2 (two) times daily for 7 days. 08/18/21 08/25/21 Yes Haroldine Laws, CNM  Prenatal Multivit-Min-Fe-FA (PRENATAL, W/IRON & FA,) 27-0.8 MG TABS Take 1 tablet by mouth daily.   Yes [provider]  Norgestimate-Ethinyl Estradiol Triphasic (TRI-PREVIFEM) 0.18/0.215/0.25 MG-35 MCG tablet Take 1 tablet by mouth at bedtime.   10/28/20  [provider]     Prenatal care site: [facility]   Social History: She  reports that she has never smoked. She has never used smokeless tobacco. She reports current alcohol use. She reports current drug use. Drug: Marijuana.  Family History: family history includes Cancer in her paternal grandfather; Cerebral palsy in her brother; Lung cancer in her maternal grandfather and maternal grandmother; Seizures in her brother.   Review of Systems: A full review of systems was performed and negative except as noted in the HPI.    Physical Exam:  Vital Signs: BP 131/74 (BP Location:  Right Arm)   Pulse (!) 101   Temp 98.6 F (37 C) (Oral)   Resp 16   Ht 5\' 4"  (1.626 m)   Wt 92.1 kg   LMP 11/12/2020   BMI 34.84 kg/m   General:   alert and cooperative  Skin:  normal  Neurologic:    Alert & oriented x 3  Lungs:    Nl effort  Heart:   regular rate and rhythm  Abdomen:  soft, non-tender; bowel sounds normal; no masses,  no organomegaly  Extremities: : non-tender, symmetric, no edema bilaterally.      EFW: 07/11/21: 6lb3oz(2794g)= 66%  Results for orders placed or performed during the hospital encounter of 08/19/21 (from the past 24 hour(s))  ROM Plus ([facility] only)     Status: None   Collection Time: 08/19/21  8:57 PM  Result Value Ref Range   Rom Plus POSITIVE     Pertinent Results:  Prenatal Labs: Blood type/Rh A pos  Antibody screen neg  Rubella Non Immune  Varicella Non Immune  RPR NR  HBsAg Neg  HIV NR  GC neg  Chlamydia neg  Genetic screening negative  1 hour GTT 138  3 hour GTT F76, 124, 135, 118  GBS Neg   FHT: FHR: 145 bpm, variability: moderate,  accelerations:  Present,  decelerations:  Absent Category/reactivity:  Category I TOCO: regular, every 2-6 minutes SVE: Dilation: Fingertip / Effacement (%): 50 / Station: -3     Assessment:  Jenny Grant is a 20  y.o. G8P0010 female at [redacted]w[redacted]d with SROM.   Plan:  1. Admit to Labor & Delivery; consents reviewed and obtained  2. Fetal Well being  - Fetal Tracing: Cat I - GBS neg - Presentation: vtx confirmed by SVE   3. Routine OB: - Prenatal labs reviewed, as above - Rh pos - CBC & T&S on admit - Clear fluids, IVF  4. Monitoring of Labor -  Contractions by external toco in place -  Plan for continuous fetal monitoring  -  Maternal pain control as desired: IVPM, nitrous, regional anesthesia - Anticipate vaginal delivery  5. Post Partum Planning: - Infant feeding: Breastfeeding - Contraception: POPs -Tdap Given 06/09/21 -Flu Given 08/14/21  Haroldine Laws, CNM 08/19/2021  9:22 PM

## 2021-08-20 ENCOUNTER — Encounter: Payer: Self-pay | Admitting: Obstetrics and Gynecology

## 2021-08-20 ENCOUNTER — Inpatient Hospital Stay: Payer: Medicaid Other | Admitting: Anesthesiology

## 2021-08-20 LAB — RPR: RPR Ser Ql: NONREACTIVE

## 2021-08-20 MED ORDER — PHENYLEPHRINE 40 MCG/ML (10ML) SYRINGE FOR IV PUSH (FOR BLOOD PRESSURE SUPPORT): 80.0000 ug | PREFILLED_SYRINGE | INTRAVENOUS | Status: DC | PRN

## 2021-08-20 MED ORDER — TERBUTALINE SULFATE 1 MG/ML IJ SOLN
0.2500 mg | Freq: Once | INTRAMUSCULAR | Status: AC
  Administered 2021-08-20: 0.25 mg via SUBCUTANEOUS
  Filled 2021-08-20: qty 1

## 2021-08-20 MED ORDER — MISOPROSTOL 200 MCG PO TABS
ORAL_TABLET | ORAL | Status: AC
  Administered 2021-08-20: 25 ug via VAGINAL
  Filled 2021-08-20: qty 4

## 2021-08-20 MED ORDER — LACTATED RINGERS IV SOLN
500.0000 mL | Freq: Once | INTRAVENOUS | Status: AC
  Administered 2021-08-20: 500 mL via INTRAVENOUS

## 2021-08-20 MED ORDER — EPHEDRINE 5 MG/ML INJ
10.0000 mg | INTRAVENOUS | Status: AC | PRN
  Administered 2021-08-20 – 2021-08-21 (×2): 10 mg via INTRAVENOUS

## 2021-08-20 MED ORDER — CALCIUM CARBONATE ANTACID 500 MG PO CHEW
CHEWABLE_TABLET | ORAL | Status: AC
  Filled 2021-08-20: qty 1

## 2021-08-20 MED ORDER — OXYTOCIN-SODIUM CHLORIDE 30-0.9 UT/500ML-% IV SOLN
1.0000 m[IU]/min | INTRAVENOUS | Status: DC
  Administered 2021-08-20 – 2021-08-21 (×4): 2 m[IU]/min via INTRAVENOUS
  Filled 2021-08-20 (×2): qty 500

## 2021-08-20 MED ORDER — MISOPROSTOL 25 MCG QUARTER TABLET
25.0000 ug | ORAL_TABLET | Freq: Once | ORAL | Status: AC
  Administered 2021-08-20: 25 ug via BUCCAL
  Filled 2021-08-20: qty 1

## 2021-08-20 MED ORDER — EPHEDRINE 5 MG/ML INJ
10.0000 mg | INTRAVENOUS | Status: DC | PRN
  Filled 2021-08-20 (×2): qty 4

## 2021-08-20 MED ORDER — DIPHENHYDRAMINE HCL 50 MG/ML IJ SOLN: 12.5000 mg | INTRAMUSCULAR | Status: DC | PRN

## 2021-08-20 MED ORDER — LIDOCAINE HCL (PF) 1 % IJ SOLN
30.0000 mL | INTRAMUSCULAR | Status: DC | PRN
  Filled 2021-08-20: qty 30

## 2021-08-20 MED ORDER — CALCIUM CARBONATE ANTACID 500 MG PO CHEW
1.0000 | CHEWABLE_TABLET | ORAL | Status: DC | PRN
  Administered 2021-08-20: 200 mg via ORAL

## 2021-08-20 MED ORDER — FENTANYL-BUPIVACAINE-NACL 0.5-0.125-0.9 MG/250ML-% EP SOLN
12.0000 mL/h | EPIDURAL | Status: DC | PRN
  Administered 2021-08-20: 12 mL/h via EPIDURAL

## 2021-08-20 MED ORDER — SODIUM CHLORIDE 0.9 % IV SOLN
12.5000 mg | Freq: Once | INTRAVENOUS | Status: AC
  Administered 2021-08-20: 12.5 mg via INTRAVENOUS
  Filled 2021-08-20: qty 0.5

## 2021-08-20 MED ORDER — AMMONIA AROMATIC IN INHA
RESPIRATORY_TRACT | Status: AC
  Filled 2021-08-20: qty 10

## 2021-08-20 MED ORDER — FENTANYL-BUPIVACAINE-NACL 0.5-0.125-0.9 MG/250ML-% EP SOLN
EPIDURAL | Status: AC
  Filled 2021-08-20: qty 250

## 2021-08-20 MED ORDER — SODIUM CHLORIDE 0.9 % IV SOLN
INTRAVENOUS | Status: DC | PRN
  Administered 2021-08-20 (×2): 5 mL via EPIDURAL

## 2021-08-20 MED ORDER — TERBUTALINE SULFATE 1 MG/ML IJ SOLN: 0.2500 mg | Freq: Once | INTRAMUSCULAR | Status: DC | PRN

## 2021-08-20 MED ORDER — OXYTOCIN 10 UNIT/ML IJ SOLN
INTRAMUSCULAR | Status: AC
  Filled 2021-08-20: qty 2

## 2021-08-20 MED ORDER — MISOPROSTOL 25 MCG QUARTER TABLET
25.0000 ug | ORAL_TABLET | ORAL | Status: DC
  Filled 2021-08-20 (×3): qty 1

## 2021-08-20 MED ORDER — LIDOCAINE-EPINEPHRINE (PF) 1.5 %-1:200000 IJ SOLN
INTRAMUSCULAR | Status: DC | PRN
  Administered 2021-08-20: 3 mL via EPIDURAL

## 2021-08-20 MED ORDER — MISOPROSTOL 25 MCG QUARTER TABLET
ORAL_TABLET | ORAL | Status: AC
  Administered 2021-08-20: 25 ug via VAGINAL
  Filled 2021-08-20: qty 1

## 2021-08-20 MED ORDER — LIDOCAINE HCL (PF) 1 % IJ SOLN
INTRAMUSCULAR | Status: DC | PRN
  Administered 2021-08-20: 3 mL via SUBCUTANEOUS

## 2021-08-20 MED ORDER — LIDOCAINE HCL (PF) 1 % IJ SOLN
INTRAMUSCULAR | Status: AC
  Filled 2021-08-20: qty 30

## 2021-08-20 NOTE — Progress Notes (Signed)
Labor Progress Note  Jenny Grant is a 20 y.o. G2P0010 at [redacted]w[redacted]d by LMP admitted for rupture of membranes  Subjective: Pt is comfortable. She was made aware of fetal tachy.  Objective: Fetal Assessment: FHT:  FHR: 150 bpm, variability: moderate,  accelerations:  Present,  decelerations:  Absent Category/reactivity:  Category I UC:   irregular, every 4-5 minutes SVE:    Dilation: Closed  Effacement: 50%  Station:  -3  Consistency: medium  Position: posterior  Membrane status:SROM at 1930 Amniotic color: Clear  Assessment / Plan: Continue to monitor labor progress C/w Dr. Dalbert Garnet - we will allow baby to continue to recover and then start Pitocin in the am  Labor:  No progress yet Preeclampsia:   126/78 Fetal Wellbeing:  Category I Pain Control:  Labor support without medications I/D:  Afebrile, GBS negative, SROM'd x7hr Anticipated MOD:  NSVD  Cyril Mourning, CNM 08/20/2021, 9:54 AM

## 2021-08-20 NOTE — Progress Notes (Signed)
Labor Progress Note  Effie Wahlert is a 20 y.o. G2P0010 at [redacted]w[redacted]d by LMP admitted for rupture of membranes  Subjective: Pt is visably more uncomfortable, having to concentrate through each contraction.  Objective: BP (!) 104/56 (BP Location: Left Arm)   Pulse 84   Temp 98.4 F (36.9 C) (Oral)   Resp 16   Ht 5\' 4"  (1.626 m)   Wt 92.1 kg   LMP 11/12/2020   BMI 34.84 kg/m   Fetal Assessment: FHT:  FHR: 130 bpm, variability: moderate,  accelerations:  Present,  decelerations:  Absent Category/reactivity:  Category I UC:   cramping SVE:    Dilation: 1cm  Effacement: 50%  Station:  -3  Consistency: medium  Position: posterior  Membrane status:SROM at 1930 Amniotic color: Clear  Assessment / Plan: Augmentation of labor, progressing slowly  Labor:  1120 14/10/2020 Cytotec vaginally 1530 Cytotec vaginally and buccal Preeclampsia:   104/56 Fetal Wellbeing:  Category I Pain Control:  Labor support without medications I/D:  Afebrile, GBS negative, SROM'd x20hr Anticipated MOD:  NSVD  , CNM 08/20/2021, 3:25 PM

## 2021-08-20 NOTE — Anesthesia Preprocedure Evaluation (Addendum)
Anesthesia Evaluation  Patient identified by MRN, date of birth, ID band Patient awake    Reviewed: Allergy & Precautions, H&P , NPO status , Patient's Chart, lab work & pertinent test results, reviewed documented beta blocker date and time   History of Anesthesia Complications Negative for: history of anesthetic complications  Airway Mallampati: II  TM Distance: >3 FB Neck ROM: full    Dental  (+) Dental Advidsory Given, Teeth Intact   Pulmonary neg pulmonary ROS,    Pulmonary exam normal breath sounds clear to auscultation       Cardiovascular Exercise Tolerance: Good negative cardio ROS Normal cardiovascular exam Rhythm:regular Rate:Normal     Neuro/Psych  Headaches, neg Seizures PSYCHIATRIC DISORDERS Anxiety Depression    GI/Hepatic Neg liver ROS, GERD  ,  Endo/Other  negative endocrine ROS  Renal/GU negative Renal ROS  negative genitourinary   Musculoskeletal   Abdominal   Peds  Hematology negative hematology ROS (+)   Anesthesia Other Findings Past Medical History: No date: Anxiety No date: Depression No date: Headache(784.0) No date: Migraines   Reproductive/Obstetrics (+) Pregnancy                             Anesthesia Physical Anesthesia Plan  ASA: 2  Anesthesia Plan: Spinal   Post-op Pain Management:    Induction:   PONV Risk Score and Plan:   Airway Management Planned: Natural Airway and Nasal Cannula  Additional Equipment:   Intra-op Plan:   Post-operative Plan:   Informed Consent: I have reviewed the patients History and Physical, chart, labs and discussed the procedure including the risks, benefits and alternatives for the proposed anesthesia with the patient or authorized representative who has indicated his/her understanding and acceptance.     Dental Advisory Given  Plan Discussed with: Anesthesiologist, CRNA and Surgeon  Anesthesia Plan  Comments: (Patient reports no bleeding problems and no anticoagulant use.  Plan for spinal with backup GA  Patient consented for risks of anesthesia including but not limited to:  - adverse reactions to medications - damage to eyes, teeth, lips or other oral mucosa - nerve damage due to positioning  - risk of bleeding, infection and or nerve damage from spinal that could lead to paralysis - risk of headache or failed spinal - damage to teeth, lips or other oral mucosa - sore throat or hoarseness - damage to heart, brain, nerves, lungs, other parts of body or loss of life  Patient voiced understanding.)       Anesthesia Quick Evaluation

## 2021-08-20 NOTE — Progress Notes (Signed)
Pitocin initiated per CNM orders

## 2021-08-20 NOTE — Progress Notes (Signed)
Labor Progress Note  Mahayla Haddaway is a 20 y.o. G2P0010 at [redacted]w[redacted]d by LMP admitted for rupture of membranes  Subjective: Pt requesting epidural  Objective: BP (!) 99/57   Pulse 93   Temp 98.5 F (36.9 C) (Oral)   Resp 16   Ht 5\' 4"  (1.626 m)   Wt 92.1 kg   LMP 11/12/2020   SpO2 97%   BMI 34.84 kg/m   Fetal Assessment: FHT:  FHR: 140 bpm, variability: moderate,  accelerations:  Present,  decelerations:  Absent Category/reactivity:  Category I UC:   every 6-8 mons SVE:    Dilation: 1cm  Effacement: 50%  Station:  -3  Consistency: medium  Position: posterior  Membrane status:SROM at 1930 Amniotic color: Clear  Assessment / Plan: Augmentation of labor, progressing slowly  Labor:  1120 14/10/2020 Cytotec vaginally 1530 Cytotec vaginally and buccal Preeclampsia:   117/75 Fetal Wellbeing:  Category I Pain Control:  Labor support without medications I/D:  Afebrile, GBS negative, SROM'd x24hr Anticipated MOD:  NSVD  , CNM 08/20/2021, 8:32 PM

## 2021-08-20 NOTE — Progress Notes (Signed)
Labor Progress Note  Jenny Grant is a 20 y.o. G2P0010 at [redacted]w[redacted]d by LMP admitted for rupture of membranes  Subjective: Pt is visably more uncomfortable, recently asked for epidural however Dr. Karlton Lemon refused to place epidrual   Objective: BP 111/76 (BP Location: Left Arm)   Pulse 79   Temp 98.5 F (36.9 C) (Oral)   Resp 16   Ht 5\' 4"  (1.626 m)   Wt 92.1 kg   LMP 11/12/2020   BMI 34.84 kg/m   Fetal Assessment: FHT:  FHR: 135 bpm, variability: moderate,  accelerations:  Present,  decelerations:  Absent Category/reactivity:  Category I UC:   regular, every 2-3 minutes SVE:   deferred at this time Membrane status:SROM at 1930 Amniotic color: Clear  Assessment / Plan: Augmentation of labor, progressing slowly  Labor: Pitocin stopped and bolus given. 14/10/2020 Cytotec placed vaginally Preeclampsia:   111/76 Fetal Wellbeing:  Category I Pain Control:  Labor support without medications, has asked for epidural  I/D:  Afebrile, GBS negative, SROM'd x16hr Anticipated MOD:  NSVD  04-26-2004, CNM 08/20/2021, 11:17 AM

## 2021-08-20 NOTE — Progress Notes (Signed)
Labor Progress Note  Jenny Grant is a 20 y.o. G2P0010 at [redacted]w[redacted]d by LMP admitted for rupture of membranes  Subjective: Pt reports UCs are not as intense and that she is feeling everything in her back.  Objective: BP 126/78   Pulse 79   Temp 98.5 F (36.9 C) (Oral)   Resp 16   Ht 5\' 4"  (1.626 m)   Wt 92.1 kg   LMP 11/12/2020   BMI 34.84 kg/m   Fetal Assessment: FHT:  FHR: 175 bpm, variability: moderate,  accelerations:  Abscent,  decelerations:  Present to 150 over 2-4 min Category/reactivity:  Category II UC:   regular, every 4-6 minutes SVE:    Dilation: Closed  Effacement: 50%  Station:  -3  Consistency: medium  Position: posterior  Membrane status:SROM at 1930 Amniotic color: Clear  Labs: Lab Results  Component Value Date   WBC 14.4 (H) 08/19/2021   HGB 13.8 08/19/2021   HCT 39.6 08/19/2021   MCV 90.0 08/19/2021   PLT 250 08/19/2021    Assessment / Plan: No progressing cervical change, considering Pitocin.  Labor: No progressing cervical change over 4 hours Preeclampsia:   126/78 Fetal Wellbeing:  Category II Pain Control:  Labor support without medications I/D:   Afebrile, GBS negative, SROM'd x5hr Anticipated MOD:  NSVD  Jenifer E Arnika Larzelere, CNM 08/20/2021, 1:12 AM

## 2021-08-20 NOTE — Progress Notes (Signed)
Labor Progress Note  Jenny Grant is a 20 y.o. G2P0010 at [redacted]w[redacted]d by LMP admitted for rupture of membranes  Subjective: Pt reports UCs are not felt in her back anymore but now abdominally.    Objective: BP 108/80 (BP Location: Left Arm)   Pulse 96   Temp 98.4 F (36.9 C) (Oral)   Resp 16   Ht 5\' 4"  (1.626 m)   Wt 92.1 kg   LMP 11/12/2020   BMI 34.84 kg/m   Fetal Assessment: FHT:  FHR: 135 bpm, variability: moderate,  accelerations:  Present,  decelerations:  Absent Category/reactivity:  Category I UC:   regular, every 3-5 minutes SVE:    Dilation: Closed  Effacement: 50%  Station:  -3  Consistency: medium  Position: posterior  Membrane status:SROM at 1930 Amniotic color: Clear  Assessment / Plan: Augmentation of labor, progressing slowly  Labor: Progressing on Pitocin - on 33mU Preeclampsia:   108/80 Fetal Wellbeing:  Category I Pain Control:  Labor support without medications, may consider epidural soon I/D:  Afebrile, GBS negative, SROM'd x14hr Anticipated MOD:  NSVD  9m, CNM 08/20/2021, 9:42 AM

## 2021-08-20 NOTE — Anesthesia Procedure Notes (Signed)
Epidural Patient location during procedure: OB Start time: 08/20/2021 7:51 PM End time: 08/20/2021 7:56 PM  Staffing Anesthesiologist: Lenard Simmer, MD Performed: anesthesiologist   Preanesthetic Checklist Completed: patient identified, IV checked, site marked, risks and benefits discussed, surgical consent, monitors and equipment checked, pre-op evaluation and timeout performed  Epidural Patient position: sitting Prep: ChloraPrep Patient monitoring: heart rate, continuous pulse ox and blood pressure Approach: midline Location: L3-L4 Injection technique: LOR saline  Needle:  Needle type: Tuohy  Needle gauge: 17 G Needle length: 9 cm and 9 Needle insertion depth: 5 cm Catheter type: closed end flexible Catheter size: 19 Gauge Catheter at skin depth: 10 cm Test dose: negative and 1.5% lidocaine with Epi 1:200 K  Assessment Sensory level: T10 Events: blood not aspirated, injection not painful, no injection resistance, no paresthesia and negative IV test  Additional Notes 1st attempt Pt. Evaluated and documentation done after procedure finished. Patient identified. Risks/Benefits/Options discussed with patient including but not limited to bleeding, infection, nerve damage, paralysis, failed block, incomplete pain control, headache, blood pressure changes, nausea, vomiting, reactions to medication both or allergic, itching and postpartum back pain. Confirmed with bedside nurse the patient's most recent platelet count. Confirmed with patient that they are not currently taking any anticoagulation, have any bleeding history or any family history of bleeding disorders. Patient expressed understanding and wished to proceed. All questions were answered. Sterile technique was used throughout the entire procedure. Please see nursing notes for vital signs. Test dose was given through epidural catheter and negative prior to continuing to dose epidural or start infusion. Warning signs of high  block given to the patient including shortness of breath, tingling/numbness in hands, complete motor block, or any concerning symptoms with instructions to call for help. Patient was given instructions on fall risk and not to get out of bed. All questions and concerns addressed with instructions to call with any issues or inadequate analgesia.    Patient tolerated the insertion well without immediate complications.Reason for block:procedure for pain

## 2021-08-20 NOTE — Progress Notes (Signed)
Labor Progress Note  Jenny Grant is a 19 y.o. G2P0010 at [redacted]w[redacted]d by LMP admitted for rupture of membranes  Subjective: Pt comfortable with epidural, however she is anxious about the epidural affects on her face (see anesthesiologist's note)  Objective: BP (!) 99/57   Pulse 93   Temp 98.5 F (36.9 C) (Oral)   Resp 16   Ht 5\' 4"  (1.626 m)   Wt 92.1 kg   LMP 11/12/2020   SpO2 97%   BMI 34.84 kg/m   Fetal Assessment: FHT:  FHR: 140 bpm, variability: moderate,  accelerations:  Present,  decelerations:  Absent Category/reactivity:  Category I UC:   every 6-8 mons SVE:    Dilation: 1cm  Effacement: 50%  Station:  -3  Consistency: medium  Position: posterior  Membrane status:SROM at 1930 Amniotic color: Clear  Assessment / Plan: Augmentation of labor, progressing slowly  Labor:  1120 14/10/2020 Cytotec vaginally 1530 Cytotec vaginally and buccal 2030 Foley bulb placed Preeclampsia:   117/75 Fetal Wellbeing:  Category I Pain Control:  Labor support without medications I/D:  Afebrile, GBS negative, SROM'd x24hr Anticipated MOD:  NSVD  , CNM 08/20/2021, 8:35 PM

## 2021-08-21 ENCOUNTER — Encounter
Admission: EM | Disposition: A | Discharge status: Home / Self Care | Payer: Self-pay | Source: Home / Self Care | Attending: Obstetrics and Gynecology

## 2021-08-21 ENCOUNTER — Encounter: Payer: Self-pay | Admitting: Obstetrics and Gynecology

## 2021-08-21 SURGERY — Surgical Case
Anesthesia: Spinal

## 2021-08-21 MED ORDER — ACETAMINOPHEN 500 MG PO TABS
1000.0000 mg | ORAL_TABLET | Freq: Four times a day (QID) | ORAL | Status: DC
Start: 2021-08-21 — End: 2021-08-23
  Administered 2021-08-21 – 2021-08-23 (×6): 1000 mg via ORAL
  Filled 2021-08-21 (×7): qty 2

## 2021-08-21 MED ORDER — KETAMINE HCL 50 MG/ML IJ SOLN
INTRAMUSCULAR | Status: DC | PRN
  Administered 2021-08-21: 30 mg via INTRAMUSCULAR
  Administered 2021-08-21: 50 mg via INTRAMUSCULAR

## 2021-08-21 MED ORDER — KETOROLAC TROMETHAMINE 30 MG/ML IJ SOLN
30.0000 mg | Freq: Four times a day (QID) | INTRAMUSCULAR | Status: DC
Start: 2021-08-21 — End: 2021-08-21

## 2021-08-21 MED ORDER — DIBUCAINE (PERIANAL) 1 % EX OINT: 1.0000 "application " | TOPICAL_OINTMENT | CUTANEOUS | Status: DC | PRN

## 2021-08-21 MED ORDER — ONDANSETRON HCL 4 MG/2ML IJ SOLN
INTRAMUSCULAR | Status: DC | PRN
  Administered 2021-08-21: 4 mg via INTRAVENOUS

## 2021-08-21 MED ORDER — KETAMINE HCL 50 MG/ML IJ SOLN
INTRAMUSCULAR | Status: AC
  Filled 2021-08-21: qty 10

## 2021-08-21 MED ORDER — SODIUM CHLORIDE 0.9 % IV SOLN
INTRAVENOUS | Status: DC | PRN
  Administered 2021-08-21: 50 ug/min via INTRAVENOUS

## 2021-08-21 MED ORDER — OXYTOCIN-SODIUM CHLORIDE 30-0.9 UT/500ML-% IV SOLN
INTRAVENOUS | Status: DC | PRN
  Administered 2021-08-21: 300 mL via INTRAVENOUS

## 2021-08-21 MED ORDER — METHYLERGONOVINE MALEATE 0.2 MG/ML IJ SOLN
INTRAMUSCULAR | Status: AC
  Filled 2021-08-21: qty 1

## 2021-08-21 MED ORDER — SIMETHICONE 80 MG PO CHEW
80.0000 mg | CHEWABLE_TABLET | ORAL | Status: DC | PRN
  Administered 2021-08-22: 80 mg via ORAL
  Filled 2021-08-21: qty 1

## 2021-08-21 MED ORDER — MORPHINE SULFATE (PF) 0.5 MG/ML IJ SOLN
INTRAMUSCULAR | Status: AC
  Filled 2021-08-21: qty 10

## 2021-08-21 MED ORDER — MENTHOL 3 MG MT LOZG
1.0000 | LOZENGE | OROMUCOSAL | Status: DC | PRN
  Filled 2021-08-21: qty 9

## 2021-08-21 MED ORDER — MIDAZOLAM HCL 2 MG/2ML IJ SOLN
INTRAMUSCULAR | Status: DC | PRN
  Administered 2021-08-21: 2 mg via INTRAVENOUS

## 2021-08-21 MED ORDER — MORPHINE SULFATE (PF) 0.5 MG/ML IJ SOLN
INTRAMUSCULAR | Status: DC | PRN
  Administered 2021-08-21: .1 mg via INTRATHECAL

## 2021-08-21 MED ORDER — SIMETHICONE 80 MG PO CHEW
80.0000 mg | CHEWABLE_TABLET | Freq: Three times a day (TID) | ORAL | Status: DC
  Administered 2021-08-21 – 2021-08-23 (×5): 80 mg via ORAL
  Filled 2021-08-21 (×5): qty 1

## 2021-08-21 MED ORDER — CEFAZOLIN SODIUM-DEXTROSE 2-4 GM/100ML-% IV SOLN
2.0000 g | Freq: Once | INTRAVENOUS | Status: AC
  Administered 2021-08-21: 2 g via INTRAVENOUS
  Filled 2021-08-21: qty 100

## 2021-08-21 MED ORDER — LACTATED RINGERS IV SOLN
INTRAVENOUS | Status: DC
Start: 2021-08-21 — End: 2021-08-23

## 2021-08-21 MED ORDER — FENTANYL CITRATE (PF) 100 MCG/2ML IJ SOLN
INTRAMUSCULAR | Status: DC | PRN
  Administered 2021-08-21: 50 ug via INTRAVENOUS
  Administered 2021-08-21: 40 ug via INTRAVENOUS

## 2021-08-21 MED ORDER — SENNOSIDES-DOCUSATE SODIUM 8.6-50 MG PO TABS
2.0000 | ORAL_TABLET | ORAL | Status: DC
Start: 2021-08-21 — End: 2021-08-23
  Administered 2021-08-21 – 2021-08-22 (×2): 2 via ORAL
  Filled 2021-08-21 (×2): qty 2

## 2021-08-21 MED ORDER — SOD CITRATE-CITRIC ACID 500-334 MG/5ML PO SOLN: 30.0000 mL | ORAL | Status: AC

## 2021-08-21 MED ORDER — FENTANYL CITRATE (PF) 100 MCG/2ML IJ SOLN
INTRAMUSCULAR | Status: AC
  Filled 2021-08-21: qty 2

## 2021-08-21 MED ORDER — CARBOPROST TROMETHAMINE 250 MCG/ML IM SOLN
INTRAMUSCULAR | Status: AC
  Filled 2021-08-21: qty 1

## 2021-08-21 MED ORDER — SODIUM CHLORIDE FLUSH 0.9 % IV SOLN
INTRAVENOUS | Status: DC | PRN
  Administered 2021-08-21: 100 mL

## 2021-08-21 MED ORDER — KETOROLAC TROMETHAMINE 30 MG/ML IJ SOLN
INTRAMUSCULAR | Status: DC | PRN
  Administered 2021-08-21: 30 mg via INTRAVENOUS

## 2021-08-21 MED ORDER — KETOROLAC TROMETHAMINE 30 MG/ML IJ SOLN
30.0000 mg | Freq: Four times a day (QID) | INTRAMUSCULAR | Status: AC
  Administered 2021-08-21 – 2021-08-22 (×3): 30 mg via INTRAVENOUS
  Filled 2021-08-21 (×4): qty 1

## 2021-08-21 MED ORDER — KETOROLAC TROMETHAMINE 30 MG/ML IJ SOLN
INTRAMUSCULAR | Status: AC
  Filled 2021-08-21: qty 1

## 2021-08-21 MED ORDER — GABAPENTIN 300 MG PO CAPS
300.0000 mg | ORAL_CAPSULE | Freq: Every day | ORAL | Status: DC
  Administered 2021-08-21 – 2021-08-22 (×2): 300 mg via ORAL
  Filled 2021-08-21 (×3): qty 1

## 2021-08-21 MED ORDER — OXYCODONE HCL 5 MG PO TABS: 5.0000 mg | ORAL_TABLET | ORAL | Status: DC | PRN

## 2021-08-21 MED ORDER — BUPIVACAINE IN DEXTROSE 0.75-8.25 % IT SOLN
INTRATHECAL | Status: DC | PRN
  Administered 2021-08-21: 1.5 mL via INTRATHECAL

## 2021-08-21 MED ORDER — SOD CITRATE-CITRIC ACID 500-334 MG/5ML PO SOLN
ORAL | Status: AC
  Administered 2021-08-21: 30 mL via ORAL
  Filled 2021-08-21: qty 15

## 2021-08-21 MED ORDER — OXYTOCIN-SODIUM CHLORIDE 30-0.9 UT/500ML-% IV SOLN
INTRAVENOUS | Status: AC
  Administered 2021-08-21: 2.5 [IU]/h via INTRAVENOUS
  Filled 2021-08-21: qty 500

## 2021-08-21 MED ORDER — FERROUS SULFATE 325 (65 FE) MG PO TABS
325.0000 mg | ORAL_TABLET | Freq: Two times a day (BID) | ORAL | Status: DC
  Administered 2021-08-22 – 2021-08-23 (×3): 325 mg via ORAL
  Filled 2021-08-21 (×3): qty 1

## 2021-08-21 MED ORDER — OXYCODONE HCL 5 MG PO TABS: 5.0000 mg | ORAL_TABLET | ORAL | Status: AC | PRN

## 2021-08-21 MED ORDER — SODIUM CHLORIDE 0.9 % IV SOLN
500.0000 mg | Freq: Once | INTRAVENOUS | Status: AC
  Administered 2021-08-21: 500 mg via INTRAVENOUS
  Filled 2021-08-21: qty 500

## 2021-08-21 MED ORDER — FLEET ENEMA 7-19 GM/118ML RE ENEM: 1.0000 | ENEMA | Freq: Every day | RECTAL | Status: DC | PRN

## 2021-08-21 MED ORDER — IBUPROFEN 600 MG PO TABS
600.0000 mg | ORAL_TABLET | Freq: Four times a day (QID) | ORAL | Status: DC
  Administered 2021-08-22 – 2021-08-23 (×4): 600 mg via ORAL
  Filled 2021-08-21 (×4): qty 1

## 2021-08-21 MED ORDER — FENTANYL CITRATE (PF) 100 MCG/2ML IJ SOLN
INTRAMUSCULAR | Status: DC | PRN
  Administered 2021-08-21: 10 ug via INTRATHECAL

## 2021-08-21 MED ORDER — MIDAZOLAM HCL 2 MG/2ML IJ SOLN
INTRAMUSCULAR | Status: AC
  Filled 2021-08-21: qty 2

## 2021-08-21 MED ORDER — SODIUM CHLORIDE (PF) 0.9 % IJ SOLN
INTRAMUSCULAR | Status: AC
  Administered 2021-08-21: 50 mL
  Filled 2021-08-21: qty 50

## 2021-08-21 MED ORDER — KETOROLAC TROMETHAMINE 30 MG/ML IJ SOLN: 30.0000 mg | Freq: Four times a day (QID) | INTRAMUSCULAR | Status: DC

## 2021-08-21 MED ORDER — PRENATAL MULTIVITAMIN CH
1.0000 | ORAL_TABLET | Freq: Every day | ORAL | Status: DC
  Administered 2021-08-22: 1 via ORAL
  Filled 2021-08-21: qty 1

## 2021-08-21 MED ORDER — BUPIVACAINE HCL (PF) 0.5 % IJ SOLN: 30.0000 mL | Freq: Once | INTRAMUSCULAR | Status: AC

## 2021-08-21 MED ORDER — BUPIVACAINE LIPOSOME 1.3 % IJ SUSP
INTRAMUSCULAR | Status: AC
  Administered 2021-08-21: 266 mg
  Filled 2021-08-21: qty 20

## 2021-08-21 MED ORDER — ACETAMINOPHEN 325 MG PO TABS: 650.0000 mg | ORAL_TABLET | Freq: Four times a day (QID) | ORAL | Status: DC

## 2021-08-21 MED ORDER — OXYTOCIN-SODIUM CHLORIDE 30-0.9 UT/500ML-% IV SOLN
2.5000 [IU]/h | INTRAVENOUS | Status: AC
Start: 2021-08-21 — End: 2021-08-22

## 2021-08-21 MED ORDER — BUPIVACAINE HCL (PF) 0.5 % IJ SOLN
INTRAMUSCULAR | Status: AC
  Administered 2021-08-21: 30 mL
  Filled 2021-08-21: qty 30

## 2021-08-21 MED ORDER — BISACODYL 10 MG RE SUPP
10.0000 mg | Freq: Every day | RECTAL | Status: DC | PRN
  Filled 2021-08-21: qty 1

## 2021-08-21 MED ORDER — BUPIVACAINE LIPOSOME 1.3 % IJ SUSP: 20.0000 mL | Freq: Once | INTRAMUSCULAR | Status: AC

## 2021-08-21 MED ORDER — SODIUM CHLORIDE 0.9% FLUSH
50.0000 mL | Freq: Once | INTRAVENOUS | Status: AC
  Filled 2021-08-21: qty 51

## 2021-08-21 MED ORDER — DIPHENHYDRAMINE HCL 25 MG PO CAPS: 25.0000 mg | ORAL_CAPSULE | Freq: Four times a day (QID) | ORAL | Status: DC | PRN

## 2021-08-21 MED ORDER — WITCH HAZEL-GLYCERIN EX PADS: 1.0000 "application " | MEDICATED_PAD | CUTANEOUS | Status: DC | PRN

## 2021-08-21 MED ORDER — COCONUT OIL OIL
1.0000 "application " | TOPICAL_OIL | Status: DC | PRN
  Filled 2021-08-21 (×2): qty 120

## 2021-08-21 SURGICAL SUPPLY — 26 items
CHLORAPREP W/TINT 26 (MISCELLANEOUS) ×2 IMPLANT
DRSG TELFA 3X8 NADH (GAUZE/BANDAGES/DRESSINGS) ×2 IMPLANT
ELECT REM PT RETURN 9FT ADLT (ELECTROSURGICAL) ×2
ELECTRODE REM PT RTRN 9FT ADLT (ELECTROSURGICAL) ×1 IMPLANT
GAUZE SPONGE 4X4 12PLY STRL (GAUZE/BANDAGES/DRESSINGS) ×2 IMPLANT
GOWN STRL REUS W/ TWL LRG LVL3 (GOWN DISPOSABLE) ×3 IMPLANT
GOWN STRL REUS W/TWL LRG LVL3 (GOWN DISPOSABLE) ×3
MANIFOLD NEPTUNE II (INSTRUMENTS) ×2 IMPLANT
MAT PREVALON FULL STRYKER (MISCELLANEOUS) ×2 IMPLANT
NEEDLE HYPO 25GX1X1/2 BEV (NEEDLE) ×2 IMPLANT
NS IRRIG 1000ML POUR BTL (IV SOLUTION) ×2 IMPLANT
PACK C SECTION AR (MISCELLANEOUS) ×2 IMPLANT
PAD OB MATERNITY 4.3X12.25 (PERSONAL CARE ITEMS) ×2 IMPLANT
PAD PREP 24X41 OB/GYN DISP (PERSONAL CARE ITEMS) ×2 IMPLANT
PENCIL SMOKE EVACUATOR (MISCELLANEOUS) ×2 IMPLANT
SCRUB EXIDINE 4% CHG 4OZ (MISCELLANEOUS) ×2 IMPLANT
SUT MNCRL 4-0 (SUTURE) ×1
SUT MNCRL 4-0 27XMFL (SUTURE) ×1
SUT VIC AB 0 CT1 36 (SUTURE) ×6 IMPLANT
SUT VIC AB 0 CTX 36 (SUTURE) ×2
SUT VIC AB 0 CTX36XBRD ANBCTRL (SUTURE) ×2 IMPLANT
SUT VIC AB 2-0 SH 27 (SUTURE) ×2
SUT VIC AB 2-0 SH 27XBRD (SUTURE) ×2 IMPLANT
SUTURE MNCRL 4-0 27XMF (SUTURE) ×1 IMPLANT
SYR 30ML LL (SYRINGE) ×4 IMPLANT
WATER STERILE IRR 500ML POUR (IV SOLUTION) ×2 IMPLANT

## 2021-08-21 NOTE — Op Note (Signed)
  Cesarean Section Procedure Note  Date of procedure: 08/21/2021   Pre-operative Diagnosis: Intrauterine pregnancy at [redacted]w[redacted]d; failure to progress; Cat II strip  Post-operative Diagnosis: same, delivered.  Procedure: Primary Low Transverse Cesarean Section through Pfannenstiel incision  Surgeon: Christeen Douglas, MD  Assistant(s):  Heloise Ochoa, CNM   Anesthesia: Epidural anesthesia and Spinal anesthesia  Anesthesiologist: Piscitello, Cleda Mccreedy, MD Anesthesiologist: Lenard Simmer, MD; Piscitello, Cleda Mccreedy, MD CRNA: Irving Burton, CRNA  Estimated Blood Loss:   600cc         Drains: foley         Total IV Fluids:  Urine Output: see anesthesia report         Specimens: none         Complications:  None; patient tolerated the procedure well.         Disposition: PACU - hemodynamically stable.         Condition: stable  Findings:  A female infant "Isley" in cephalic presentation. Amniotic fluid - Clear  Birth weight 3510 g.  Apgars of 8 and 9 at one and five minutes respectively.  Intact placenta with a three-vessel cord.  Grossly normal uterus, tubes and ovaries bilaterally. No intraabdominal adhesions were noted.  Indications: failure to progress: arrest of dilation and non-reassuring fetal status  Procedure Details  The patient was taken to Operating Room, identified as the correct patient and the procedure verified as C-Section Delivery. A formal Time Out was held with all team members present and in agreement.  After induction of anesthesia, the patient was draped and prepped in the usual sterile manner. A Pfannenstiel skin incision was made and carried down through the subcutaneous tissue to the fascia. Fascial incision was made and extended transversely with the Mayo scissors. The fascia was separated from the underlying rectus tissue superiorly and inferiorly. The peritoneum was identified and entered bluntly. Peritoneal incision was extended longitudinally.  The utero-vesical peritoneal reflection was incised transversely and a bladder flap was created digitally.   A low transverse hysterotomy was made. The fetus was delivered atraumatically. The umbilical cord was clamped x2 and cut and the infant was handed to the awaiting pediatricians. The placenta was removed intact and appeared normal, intact, and with a 3-vessel cord.   The uterus was exteriorized and cleared of all clot and debris. The hysterotomy was closed with running sutures of 0-Vicryl. A second imbricating layer was placed with the same suture. Excellent hemostasis was observed. The peritoneal cavity was cleared of all clots and debris. The uterus was returned to the abdomen.   The pelvis was irrigated and again, excellent hemostasis was noted. The fascia was then reapproximated with running sutures of 0 Vicryl. The subcutaneous tissue was reapproximated with interrupted sutures of 0 Vicry. The skin was reapproximated with a 4-0 Monocryl subcuticular stitch. 75ml (in 30 of 0.5% bupivicaine and 54ml of NSS) of liposomal bupivicaine placed in the fascial and skin lines.  Instrument, sponge, and needle counts were correct prior to the abdominal closure and at the conclusion of the case.   The patient tolerated the procedure well and was transferred to the recovery room in stable condition.   Christeen Douglas, MD 08/21/2021

## 2021-08-21 NOTE — Discharge Summary (Signed)
Obstetrical Discharge Summary  Patient Name: Jenny Grant DOB: 2000-12-13 MRN: [id number]  Date of Admission: 08/19/2021 Date of Delivery: 08/21/21 Delivered by: Jenny Garnet MD Date of Discharge: 08/23/2021  [facility]: [facility]  VZD:[id number]'P last menstrual period was 11/12/2020. EDC Estimated Date of Delivery: 08/19/21 Gestational Age at Delivery: [redacted]w[redacted]d   Antepartum complications:  1. History depression/anxiety 2. COVID during pregnancy 3. Varicella and rubella non-immune  Admitting Diagnosis: spontaneous ROM, 40wks Secondary Diagnosis: primary LTCS  Patient Active Problem List   Diagnosis Date Noted   Pregnancy 08/19/2021   Amniotic fluid leaking 08/18/2021   Migraine without aura and without status migrainosus, not intractable 05/12/2014   Tension type headache 05/12/2014   Anxiety state, unspecified 05/12/2014   Depressed mood 05/12/2014    Augmentation: AROM, Pitocin, and IP Foley Complications: ROM>24 hours Intrapartum complications/course: prolonged ROM, augmented with cytotec, Pitocin and foley balloon, progressed to 6cm, persistent late decels occurred with adequate MVUs and contraction pattern.  Date of Delivery: 08/21/21 Delivered By: Jenny Garnet MD Delivery Type: primary cesarean section, low transverse incision Anesthesia: epidural -> spinal for CS Placenta: manual Laceration: none Episiotomy: none Newborn Data: Live born female  Birth Weight: 7 lb 11.8 oz (3510 g) APGAR: 9, 9  Newborn Delivery   Birth date/time: 08/21/2021 15:00:00 Delivery type: C-Section, Low Transverse Trial of labor: Yes C-section categorization: Primary      Postpartum Procedures: None  Edinburgh:  Edinburgh Postnatal Depression Scale Screening Tool 08/20/2021  I have been able to laugh and see the funny side of things. 0  I have looked forward with enjoyment to things. 0  I have blamed myself unnecessarily when things went wrong. 1  I have been anxious or  worried for no good reason. 1  I have felt scared or panicky for no good reason. 0  Things have been getting on top of me. 1  I have been so unhappy that I have had difficulty sleeping. 0  I have felt sad or miserable. 0  I have been so unhappy that I have been crying. 0  The thought of harming myself has occurred to me. 0  Edinburgh Postnatal Depression Scale Total 3      Post partum course: Cesarean Section):  Patient had an uncomplicated postpartum course.  By time of discharge on POD#2, her pain was controlled on oral pain medications; she had appropriate lochia and was ambulating, voiding without difficulty, tolerating regular diet and passing flatus.   She was deemed stable for discharge to home.    Discharge Physical Exam:  BP (!) 108/59 (BP Location: Right Arm)   Pulse 80   Temp 98 F (36.7 C) (Oral)   Resp 19   Ht 5\' 4"  (1.626 m)   Wt 92.1 kg   LMP 11/12/2020   SpO2 98%   Breastfeeding Unknown   BMI 34.84 kg/m   General: NAD CV: RRR Pulm: CTABL, nl effort ABD: s/nd/nt, fundus firm and below the umbilicus Lochia: moderate Incision: c/d/i, healing well, no significant drainage, no dehiscence, no significant erythema; covered with OP Site honey comb occlusive dressing  DVT Evaluation: LE non-ttp, no evidence of DVT on exam.  Hemoglobin  Date Value Ref Range Status  08/22/2021 10.9 (L) 12.0 - 15.0 g/dL Final   HGB  Date Value Ref Range Status  12/05/2012 13.6 12.0 - 16.0 g/dL Final   HCT  Date Value Ref Range Status  08/22/2021 31.8 (L) 36.0 - 46.0 % Final  12/05/2012 40.3 35.0 - 47.0 %  Final     Disposition: stable, discharge to home. Baby Feeding: breastmilk Baby Disposition: home with mom  Rh Immune globulin given: N/A Rubella vaccine given: NON-immune, offer prior to DC Varicella vaccine given: NON-immune,  offer prior to DC Tdap vaccine given in AP or PP setting: 06/09/21 Flu vaccine given in AP or PP setting: 08/14/21  Contraception:  POPs  Prenatal Labs:  Blood type/Rh A pos  Antibody screen neg  Rubella Non Immune  Varicella Non Immune  RPR NR  HBsAg Neg  HIV NR  GC neg  Chlamydia neg  Genetic screening negative  1 hour GTT 138  3 hour GTT F76, 124, 135, 118  GBS Neg     Plan:  Jenny Grant was discharged to home in good condition. Follow-up appointment with delivering provider in 2 weeks.  Discharge Medications: Allergies as of 08/23/2021   No Known Allergies      Medication List     STOP taking these medications    metroNIDAZOLE 500 MG tablet Commonly known as: Flagyl       TAKE these medications    acetaminophen 500 MG tablet Commonly known as: TYLENOL Take 1,000 mg by mouth every 6 (six) hours as needed.   ibuprofen 600 MG tablet Commonly known as: ADVIL Take 1 tablet (600 mg total) by mouth every 6 (six) hours as needed for mild pain or cramping.   oxyCODONE 5 MG immediate release tablet Commonly known as: Oxy IR/ROXICODONE Take 1 tablet (5 mg total) by mouth every 4 (four) hours as needed for up to 7 days for moderate pain.   Prenatal (w/Iron & FA) 27-0.8 MG Tabs Take 1 tablet by mouth daily.   senna-docusate 8.6-50 MG tablet Commonly known as: Senokot-S Take 1 tablet by mouth daily.   simethicone 80 MG chewable tablet Commonly known as: MYLICON Chew 1 tablet (80 mg total) by mouth 3 (three) times daily after meals.         Follow-up Information     Jenny Douglas, MD Follow up in 2 week(s).   Specialty: Obstetrics and Gynecology Why: For postop check Contact information: [address] [place] [address] [phone]                 Signed:  Al Corpus 08/23/2021  8:42 AM  Margaretmary Eddy, CNM Certified Nurse Midwife [facility]

## 2021-08-21 NOTE — Progress Notes (Signed)
20yo G2P0010 at [redacted]w[redacted]d with PROM since 7pm two days ago. She has not dilated past 6cm and when we have adequate mvus she does have intermittent Cat II with lates. Afebrile at this time. Epidural now turned off.   The risks of cesarean section discussed with the patient included but were not limited to: bleeding which may require transfusion or reoperation; infection which may require antibiotics; injury to bowel, bladder, ureters or other surrounding organs; injury to the fetus; need for additional procedures including hysterectomy in the event of a life-threatening hemorrhage; placental abnormalities wth subsequent pregnancies, incisional problems, thromboembolic phenomenon and other postoperative/anesthesia complications. The patient concurred with the proposed plan, giving informed written consent for the procedure.   Patient has been NPO since yesterday and she will remain NPO for procedure. Anesthesia and OR aware. Preoperative prophylactic antibiotics and SCDs ordered on call to the OR.  To OR when ready.

## 2021-08-21 NOTE — Transfer of Care (Signed)
Immediate Anesthesia Transfer of Care Note  Patient: Jenny Grant  Procedure(s) Performed: CESAREAN SECTION  Patient Location: LDR 3  Anesthesia Type:Spinal  Level of Consciousness: awake  Airway & Oxygen Therapy: Patient Spontanous Breathing  Post-op Assessment: Report given to RN and Post -op Vital signs reviewed and stable  Post vital signs: Reviewed and stable  Last Vitals:  Vitals Value Taken Time  BP 114/73 08/21/21 1605  Temp 36.8 C 08/21/21 1557  Pulse 73 08/21/21 1606  Resp 15 08/21/21 1606  SpO2 92 % 08/21/21 1606  Vitals shown include unvalidated device data.  Last Pain:  Vitals:   08/21/21 1557  TempSrc: Oral  PainSc:       Patients Stated Pain Goal: 0 (08/20/21 1940)  Complications: No notable events documented.

## 2021-08-21 NOTE — Progress Notes (Addendum)
Labor Progress Note  Jenny Grant is a 20 y.o. G2P0010 at [redacted]w[redacted]d by LMP admitted for rupture of membranes  Subjective: resting comfortably in bed with epidural  Objective: BP (!) 100/53 (BP Location: Left Arm)   Pulse 82   Temp 98.2 F (36.8 C) (Oral)   Resp 16   Ht 5\' 4"  (1.626 m)   Wt 92.1 kg   LMP 11/12/2020   SpO2 98%   BMI 34.84 kg/m  Notable VS details: reviewed  Fetal Assessment: FHT:  FHR: 135 bpm, variability: minimal and moderate,  accelerations:  Present,  decelerations:  Absent Category/reactivity:  Category I UC:   irregular, every 4-8 minutes SVE:    Dilation: 4  Effacement: 80%  Station:  -2  Consistency: soft  Position: posterior  Membrane status:SROM at 1930 08/19/2021 Amniotic color: clear  Labs: Lab Results  Component Value Date   WBC 14.4 (H) 08/19/2021   HGB 13.8 08/19/2021   HCT 39.6 08/19/2021   MCV 90.0 08/19/2021   PLT 250 08/19/2021    Assessment / Plan: Augmentation of labor, progressing slowly . Prolonged rupture of membranes.  Labor: Small cervical change. Pitocin at 68mU, IUPC placed.  Continue pitocin Preeclampsia:  no signs or symptoms of toxicity Fetal Wellbeing:  Category I Pain Control:  Epidural I/D:   Afebrile, GBS negative, SROM'd x 37hrs , no evidence of infection. Anticipated MOD:  NSVD  11m, CNM 08/21/2021, 8:32 AM

## 2021-08-21 NOTE — Progress Notes (Signed)
Labor Progress Note  Jenny Grant is a 20 y.o. G2P0010 at [redacted]w[redacted]d by LMP admitted for rupture of membranes  Subjective: Pt comfortable with epidura  Objective: BP (!) 95/46   Pulse 89   Temp 99.1 F (37.3 C) (Oral)   Resp 16   Ht 5\' 4"  (1.626 m)   Wt 92.1 kg   LMP 11/12/2020   SpO2 97%   BMI 34.84 kg/m   Fetal Assessment: FHT:  FHR: 145 bpm, variability: moderate,  accelerations:  Present,  decelerations:  Absent Category/reactivity:  Category I UC:   every 2-4 mins SVE:    Dilation: 3cm  Effacement: 80%  Station:  -2  Consistency: medium  Position: anterior  Membrane status:SROM at 1930 Amniotic color: Clear  Assessment / Plan: Augmentation of labor, progressing slowly  Labor:  1120 14/10/2020 Cytotec vaginally 1530 Cytotec vaginally and buccal 2030 Foley bulb placed 2206 Foley bulb out 2212 Pitocin started got to 99mU and then stopped at 0120 for decels  Decels resolved and pos scalp stim with SVE Preeclampsia:   95/46 Fetal Wellbeing:  Category I Pain Control:  Epidural I/D:  Afebrile, GBS negative, SROM'd x30hr Anticipated MOD:  NSVD  4m, CNM 08/21/2021, 1:42 AM

## 2021-08-21 NOTE — Anesthesia Procedure Notes (Signed)
Spinal  Patient location during procedure: OR Start time: 08/21/2021 2:37 PM End time: 08/21/2021 2:40 PM Reason for block: surgical anesthesia Staffing Performed: resident/CRNA  Anesthesiologist: Piscitello, Precious Haws, MD Resident/CRNA: Aline Brochure, CRNA Preanesthetic Checklist Completed: patient identified, IV checked, site marked, risks and benefits discussed, surgical consent, monitors and equipment checked, pre-op evaluation and timeout performed Spinal Block Patient position: sitting Prep: ChloraPrep Patient monitoring: heart rate, continuous pulse ox, blood pressure and cardiac monitor Approach: midline Location: L3-4 Injection technique: single-shot Needle Needle type: Whitacre, Introducer and Pencan  Needle gauge: 24 G Needle length: 9 cm Assessment Sensory level: T10 Events: CSF return Additional Notes Negative paresthesia. Negative blood return. Positive free-flowing CSF. Expiration date of kit checked and confirmed. Patient tolerated procedure well, without complications.

## 2021-08-21 NOTE — Progress Notes (Signed)
Labor Progress Note  Jenny Grant is a 20 y.o. G2P0010 at [redacted]w[redacted]d by LMP admitted for rupture of membranes  Subjective: resting comfortably in bed with epidural  Objective: BP 110/68 (BP Location: Left Arm)   Pulse 94   Temp 98.6 F (37 C) (Oral)   Resp 16   Ht 5\' 4"  (1.626 m)   Wt 92.1 kg   LMP 11/12/2020   SpO2 98%   BMI 34.84 kg/m  Notable VS details: reviewed  Fetal Assessment: FHT:  FHR: 140 bpm, variability: minimal to moderate,  accelerations:  Present,  decelerations:  Present early Category/reactivity:  Category II UC:   IUPC, contractions q2-49min, MVUs 170 SVE:    Dilation: 6cm  Effacement: 80%  Station:  -2  Consistency: soft  Position: middle  Membrane status:SROM @ 1930 08/19/2021 Amniotic color: clear  Labs: Lab Results  Component Value Date   WBC 14.4 (H) 08/19/2021   HGB 13.8 08/19/2021   HCT 39.6 08/19/2021   MCV 90.0 08/19/2021   PLT 250 08/19/2021    Assessment / Plan: Augmentation of labor for prolonged rupture of membranes, progressing slowly.   Labor:  Small cervical change. Pitocin had been stopped for late decelerations and now restarted at 58mU after return to Category I fhr tracing. Jenny Grant is requesting a cesarean section.  Dr. Lanora Manis made aware and will come evaluate for primary cesarean section. Preeclampsia:  no signs or symptoms of toxicity Fetal Wellbeing:  Category II Pain Control:  Epidural I/D:   Afebrile, GBS negative, SROM'd x 40hrs, no evidence of infection  Jenny Grant, CNM 08/21/2021, 11:51 AM

## 2021-08-22 ENCOUNTER — Encounter: Payer: Self-pay | Admitting: Obstetrics and Gynecology

## 2021-08-22 LAB — CBC
HCT: 31.8 % — ABNORMAL LOW (ref 36.0–46.0)
Hemoglobin: 10.9 g/dL — ABNORMAL LOW (ref 12.0–15.0)
MCH: 31.6 pg (ref 26.0–34.0)
MCHC: 34.3 g/dL (ref 30.0–36.0)
MCV: 92.2 fL (ref 80.0–100.0)
Platelets: 171 10*3/uL (ref 150–400)
RBC: 3.45 MIL/uL — ABNORMAL LOW (ref 3.87–5.11)
RDW: 14.9 % (ref 11.5–15.5)
WBC: 12.9 10*3/uL — ABNORMAL HIGH (ref 4.0–10.5)
nRBC: 0 % (ref 0.0–0.2)

## 2021-08-22 MED ORDER — VARICELLA VIRUS VACCINE LIVE 1350 PFU/0.5ML IJ SUSR
0.5000 mL | INTRAMUSCULAR | Status: AC | PRN
  Administered 2021-08-23: 0.5 mL via SUBCUTANEOUS
  Filled 2021-08-22 (×2): qty 0.5

## 2021-08-22 MED ORDER — MEASLES, MUMPS & RUBELLA VAC IJ SOLR
0.5000 mL | INTRAMUSCULAR | Status: AC | PRN
  Administered 2021-08-23: 0.5 mL via SUBCUTANEOUS
  Filled 2021-08-22 (×2): qty 0.5

## 2021-08-22 NOTE — Progress Notes (Signed)
Postop Day  1  Subjective: no complaints, up ad lib, voiding, and tolerating PO  Doing well, no concerns. Ambulating without difficulty, pain managed with PO meds, tolerating regular diet, and voiding without difficulty.   No fever/chills, chest pain, shortness of breath, nausea/vomiting, or leg pain. No nipple or breast pain. No headache, visual changes, or RUQ/epigastric pain.  Objective: BP 108/70 (BP Location: Right Arm)   Pulse 79   Temp 98 F (36.7 C) (Oral)   Resp 18   Ht 5\' 4"  (1.626 m)   Wt 92.1 kg   LMP 11/12/2020   SpO2 99%   Breastfeeding Unknown   BMI 34.84 kg/m    Physical Exam:  General: alert, cooperative, and appears stated age Breasts: soft/nontender CV: RRR Pulm: nl effort, CTABL Abdomen: soft, non-tender, active bowel sounds Uterine Fundus: firm Incision: healing well, no significant drainage, no dehiscence, no significant erythema, pressure dressing not well intact, will apply honeycomb dressing Perineum: minimal edema, intact Lochia: appropriate DVT Evaluation: No evidence of DVT seen on physical exam. Negative Homan's sign.  Recent Labs    08/19/21 2126 08/22/21 0556  HGB 13.8 10.9*  HCT 39.6 31.8*  WBC 14.4* 12.9*  PLT 250 171    Assessment/Plan: 20 y.o. G2P1011 postpartum day # 1  -Continue routine postpartum care -Lactation consult PRN for breastfeeding Encouraged snug fitting bra, cold application, Tylenol PRN, and cabbage leaves for engorgement for formula feeding  -Discussed contraceptive options including implant, IUDs hormonal and non-hormonal, injection, pills/ring/patch, condoms, and NFP.  -Acute blood loss anemia - hemodynamically stable and asymptomatic; start PO ferrous sulfate BID with stool softeners  -Immunization status:   all immunizations up to date   Disposition: Continue inpatient postpartum care    LOS: 3 days   Jenny Grant LUCY 08/24/21, CNM 08/22/2021, 9:07 AM   ---- Certified Nurse Midwife [facility]

## 2021-08-22 NOTE — Anesthesia Post-op Follow-up Note (Signed)
  Anesthesia Pain Follow-up Note  Patient: [name]  Day #: 1  Date of Follow-up: 08/22/2021 Time: 9:45 AM  Last Vitals:  Vitals:   08/22/21 0512 08/22/21 0743  BP: (!) 97/53 108/70  Pulse: 72 79  Resp: 20 18  Temp: 36.7 C 36.7 C  SpO2: 96% 99%    Level of Consciousness: alert  Pain: none   Side Effects:None  Catheter Site Exam:clean, dry, no drainage     Plan: D/C from anesthesia care at surgeon's request  Rosanne Gutting

## 2021-08-22 NOTE — Anesthesia Postprocedure Evaluation (Signed)
Anesthesia Post Note  Patient: Jenny Grant  Procedure(s) Performed: CESAREAN SECTION  Patient location during evaluation: Mother Baby Anesthesia Type: Spinal Level of consciousness: oriented and awake and alert Pain management: pain level controlled Vital Signs Assessment: post-procedure vital signs reviewed and stable Respiratory status: spontaneous breathing and respiratory function stable Cardiovascular status: blood pressure returned to baseline and stable Postop Assessment: no headache, no backache, no apparent nausea or vomiting and able to ambulate Anesthetic complications: no   No notable events documented.   Last Vitals:  Vitals:   08/22/21 0512 08/22/21 0743  BP: (!) 97/53 108/70  Pulse: 72 79  Resp: 20 18  Temp: 36.7 C 36.7 C  SpO2: 96% 99%    Last Pain:  Vitals:   08/22/21 0800  TempSrc:   PainSc: 0-No pain                 Malachi Pro C

## 2021-08-22 NOTE — Lactation Note (Signed)
This note was copied from a baby's chart. Lactation Consultation Note  Patient Name: Girl Kiki Bivens [id number] Date: 08/22/2021 Reason for consult: Follow-up assessment;Primapara;Term;Other (Comment) (c-section) Age:22 hours  Lactation follow-up. Mom in bedside chair eating breakfast, baby sleeping soundly in bassinet.  Mom reports baby did begin to feed overnight, and accepted hand expressed colostrum via syringe while at the breast as well w/ last feeding <3hrs ago; mom planning to attempt a feeding within next 30 minutes.  Mom does note some discomfort/soreness with breastfeeding, and tender nipples. Mom has received comfort gels and coconut oil and knows to alternate use.   LC discussed the consistency of colostrum and the need for stronger suck pattern during this period, reviewed transient nipple soreness and discomfort, and reviewed position and alignment of infant to ensure deep latch at the breast when feeding.  Reviewed on demand feedings, newborn stomach size and feeding patterns/behaviors. Reviewed early cues, and output expectations.  Whiteboard updated with LC name/number; encouraged to call with questions/concerns and ongoing BF support as needed.  Maternal Data Has patient been taught Hand Expression?: Yes Does the patient have breastfeeding experience prior to this delivery?: No  Feeding Mother's Current Feeding Choice: Breast Milk  LATCH Score                    Lactation Tools Discussed/Used    Interventions Interventions: Breast feeding basics reviewed;Education;Comfort gels;Coconut oil  Discharge    Consult Status Consult Status: Follow-up Date: 08/22/21 Follow-up type: In-patient    Danford Bad 08/22/2021, 11:39 AM

## 2021-08-23 MED ORDER — OXYCODONE HCL 5 MG PO TABS: 5.0000 mg | ORAL_TABLET | ORAL | 0 refills | Status: AC | PRN

## 2021-08-23 MED ORDER — IBUPROFEN 600 MG PO TABS: 600.0000 mg | ORAL_TABLET | Freq: Four times a day (QID) | ORAL | 1 refills | Status: DC | PRN

## 2021-08-23 MED ORDER — SENNOSIDES-DOCUSATE SODIUM 8.6-50 MG PO TABS
1.0000 | ORAL_TABLET | ORAL | Status: DC
Start: 2021-08-23 — End: 2023-05-08

## 2021-08-23 MED ORDER — SIMETHICONE 80 MG PO CHEW: 80.0000 mg | CHEWABLE_TABLET | Freq: Three times a day (TID) | ORAL | 0 refills | Status: DC

## 2021-08-23 NOTE — Lactation Note (Signed)
This note was copied from a baby's chart. Lactation Consultation Note  Patient Name: Jenny Grant [id number] Date: 08/23/2021 Reason for consult: Follow-up assessment;Primapara;Term Age:22 hours  Lactation visit prior to anticipated discharge. Baby active at the breast when LC entered in modified cross-cradle hold. Baby has been feeding for almost 40 minutes, but mom reports she is starting to fall asleep.  LC reviewed early cues, feeding on demand, and signs of adequate transfer and contentment. Encouraged to continue to track diapers/output.  LC educated on anticipated breast changes and management of breast fullness and engorgement. Discussed possible need for softening tissue prior to latching and nipple care. Encouraged to delay the introduction of nipples (bottles/pacifier) and the impact it can have on breastfeeding.  Information given for outpatient lactation services and community breastfeeding support provided. Encouraged to call with questions/concerns and for ongoing BF support as needed.  Maternal Data Has patient been taught Hand Expression?: Yes Does the patient have breastfeeding experience prior to this delivery?: No  Feeding Mother's Current Feeding Choice: Breast Milk  LATCH Score                    Lactation Tools Discussed/Used    Interventions Interventions: Breast feeding basics reviewed;Education;Pre-pump if needed;Reverse pressure  Discharge Discharge Education: Engorgement and breast care;Warning signs for feeding baby;Outpatient recommendation  Consult Status Consult Status: Complete Date: 08/23/21 Follow-up type: Call as needed    Danford Bad 08/23/2021, 9:44 AM

## 2021-08-23 NOTE — Progress Notes (Signed)
Patient discharged home with family.  Discharge instructions, when to follow up, and prescriptions reviewed with patient.  Patient verbalized understanding. Patient will be escorted out by auxiliary.   

## 2021-08-23 NOTE — Discharge Instructions (Addendum)
Discharge instructions:   Call office if you have any of the following:  headache, visual changes, fever >101.0 F, chills, breast concerns (engorgement, mastitis) excessive vaginal bleeding, incision drainage or problems, leg pain or redness, depression or any other concerns.   Activity: Do not lift > 10 lbs for 6 weeks.  No intercourse or tampons for 6 weeks.  No driving for 1-2 weeks or while taking pain medication. No strenuous activity or heavy lifting for 6 weeks.  No swimming pools, hot tubs or tub baths- showers only.    It is normal to bleed for up to 6 weeks. You should not soak through more than 1 pad in 1 hour.   Continue prenatal vitamin. Increase calories and fluids while breastfeeding.  Your milk will come in, in the next couple of days (right now it is colostrum).  You may have a slight fever when your milk comes in, but it should go away on its own.   If it does not, and rises above 101 F please call the doctor.  You will also feel achy and your breasts will be firm. They will also start to leak.  If you are breastfeeding, continue as you have been and you can pump/express milk for comfort.   For concerns about your baby, please call your pediatrician For breastfeeding concerns, the lactation consultant can be reached at [phone]  Postpartum blues (feelings of happy one minute and sad another minute) are normal for the first few weeks but if it gets worse let your doctor know.    Cesarean Delivery, Care After Refer to this sheet in the next few weeks. These instructions provide you with information on caring for yourself after your procedure. Your health care provider may also give you specific instructions. Your treatment has been planned according to current medical practices, but problems sometimes occur. Call your health care provider if you have any problems or questions after you go home. HOME CARE INSTRUCTIONS  Please leave honey comb dressing (OP Site) on for  7 days.  You may shower during this period but turn your back to the water so that the dressing does not get directly saturated by the water.   You may take the dressing off on day 7.  The easiest way to do it is in the shower.  Allow the water to run over the dressing and it usually comes off easier.   Only take over-the-counter or prescription medications as directed by your health care provider. Do not drink alcohol, especially if you are breastfeeding or taking medication to relieve pain. Do not  smoke tobacco. Continue to use good perineal care. Good perineal care includes: Wiping your perineum from front to back. Keeping your perineum clean. Check your surgical cut (incision) daily for increased redness, drainage, swelling, or separation of skin. Shower and clean your incision gently with soap and water every day, by letting warm and soapy water run over the incision, and then pat it dry. If your health care provider says it is okay, leave the incision uncovered. Use a bandage (dressing) if the incision is draining fluid or appears irritated. If the adhesive strips across the incision do not fall off within 7 days, carefully peel them off, after a shower. Hug a pillow when coughing or sneezing until your incision is healed. This helps to relieve pain. Do not use tampons, douches or have sexual intercourse, until your health care provider says it is okay. Wear a well-fitting bra that provides breast  support. Limit wearing support panties or control-top hose. Drink enough fluids to keep your urine clear or pale yellow. Eat high-fiber foods such as whole grain cereals and breads, brown rice, beans, and fresh fruits and vegetables every day. These foods may help prevent or relieve constipation. Resume activities such as climbing stairs, driving, lifting, exercising, or traveling as directed by your health care provider. Try to have someone help you with your household activities and your newborn  for at least a few days after you leave the hospital. Rest as much as possible. Try to rest or take a nap when your newborn is sleeping. Increase your activities gradually. Do not lift more than 15lbs until directed by a provider. Keep all of your scheduled postpartum appointments. It is very important to keep your scheduled follow-up appointments. At these appointments, your health care provider will be checking to make sure that you are healing physically and emotionally. SEEK MEDICAL CARE IF:  You are passing large clots from your vagina. Save any clots to show your health care provider. You have a foul smelling discharge from your vagina. You have trouble urinating. You are urinating frequently. You have pain when you urinate. You have a change in your bowel movements. You have increasing redness, pain, or swelling near your incision. You have pus draining from your incision. Your incision is separating. You have painful, hard, or reddened breasts. You have a severe headache. You have blurred vision or see spots. You feel sad or depressed. You have thoughts of hurting yourself or your newborn. You have questions about your care, the care of your newborn, or medications. You are dizzy or light-headed. You have a rash. You have pain, redness, or swelling at the site of the removed intravenous access (IV) tube. You have nausea or vomiting. You stopped breastfeeding and have not had a menstrual period within 12 weeks of stopping. You are not breastfeeding and have not had a menstrual period within 12 weeks of delivery. You have a fever. SEEK IMMEDIATE MEDICAL CARE IF: You have persistent pain. You have chest pain. You have shortness of breath. You faint. You have leg pain. You have stomach pain. Your vaginal bleeding saturates 2 or more sanitary pads in 1 hour. MAKE SURE YOU:  Understand these instructions. Will watch your condition. Will get help right away if you are not  doing well or get worse. Document Released: 08/11/2002 Document Revised: 04/05/2014 Document Reviewed: 07/16/2012 [facility] Patient Information 2015 [place], [place]. This information is not intended to replace advice given to you by your health care provider. Make sure you discuss any questions you have with your health care provider.

## 2022-10-22 DIAGNOSIS — Z3490 Encounter for supervision of normal pregnancy, unspecified, unspecified trimester: Secondary | ICD-10-CM | POA: Insufficient documentation

## 2022-11-08 LAB — HEPATITIS C ANTIBODY
HCV Ab: NEGATIVE
HCV Ab: NEGATIVE

## 2022-11-08 LAB — OB RESULTS CONSOLE RUBELLA ANTIBODY, IGM
Rubella: IMMUNE
Rubella: IMMUNE

## 2022-11-08 LAB — OB RESULTS CONSOLE VARICELLA ZOSTER ANTIBODY, IGG
Varicella: IMMUNE
Varicella: IMMUNE

## 2022-11-08 LAB — OB RESULTS CONSOLE HEPATITIS B SURFACE ANTIGEN
Hepatitis B Surface Ag: NEGATIVE
Hepatitis B Surface Ag: NEGATIVE

## 2023-04-26 LAB — OB RESULTS CONSOLE HIV ANTIBODY (ROUTINE TESTING)
HIV: NONREACTIVE
HIV: NONREACTIVE

## 2023-04-26 LAB — OB RESULTS CONSOLE GC/CHLAMYDIA
Chlamydia: NEGATIVE
Chlamydia: NEGATIVE
Neisseria Gonorrhea: NEGATIVE
Neisseria Gonorrhea: NEGATIVE

## 2023-04-26 LAB — OB RESULTS CONSOLE GBS
GBS: NEGATIVE
GBS: NEGATIVE

## 2023-05-03 NOTE — H&P (Signed)
Jenny Grant is a 22 y.o. [phone] with IUP at [redacted]w[redacted]d presenting for presenting for scheduled repeat cesarean section.  No acute concerns.   Prenatal Course Source of Care: [facility]  with onset of care at 8 weeks Pregnancy complications or risks: Patient Active Problem List   Diagnosis Date Noted   Supervision of high risk pregnancy in third trimester 05/14/2023   Pregnancy 08/19/2021   Amniotic fluid leaking 08/18/2021   Migraine without aura and without status migrainosus, not intractable 05/12/2014   Tension type headache 05/12/2014   Anxiety state, unspecified 05/12/2014   Depressed mood 05/12/2014   She plans to breastfeed She remains  undecided  for postpartum contraception.   Prenatal labs and studies: ABO, Rh: --/--/A POS (06/10 1002) Antibody: NEG (06/10 1002) Rubella: Immune, Immune (12/07 0000) RPR: NON REACTIVE (06/10 1002)  HBsAg: Negative, Negative (12/07 0000)  HIV: Non Reactive (06/10 1002)  ZHY:QMVHQION, Negative/-- (05/24 0000) 1 hr Glucola  124 Genetic screening normal Anatomy US normal  Past Medical History:  Diagnosis Date   Anxiety    Depression    Headache(784.0)    Migraines     Past Surgical History:  Procedure Laterality Date   CESAREAN SECTION  08/21/2021   Procedure: CESAREAN SECTION;  Surgeon: Christeen Douglas, MD;  Location: [facility] ORS;  Service: Obstetrics;;   WISDOM TOOTH EXTRACTION      OB History  Gravida Para Term Preterm AB Living  3 1 1   1 1   SAB IAB Ectopic Multiple Live Births  1     0 1    # Outcome Date GA Lbr Len/2nd Weight Sex Delivery Anes PTL Lv  3 Current           2 Term 08/21/21 [redacted]w[redacted]d  3510 g F CS-LTranv Spinal  LIV  1 SAB 2018            Social History   Socioeconomic History   Marital status: Significant Other    Spouse name: Eliberto Ivory   Number of children: 1   Years of education: Not on file   Highest education level: Not on file  Occupational History   Not on file  Tobacco Use   Smoking status: Never    Smokeless tobacco: Never  Vaping Use   Vaping Use: Former   Devices: stopped before finding out she is pregnant  Substance and Sexual Activity   Alcohol use: Not Currently    Comment: occasionally   Drug use: Not Currently    Types: Marijuana    Comment: several years since uses   Sexual activity: Yes    Birth control/protection: None  Other Topics Concern   Not on file  Social History Narrative   Not on file   Social Determinants of Health   Financial Resource Strain: Not on file  Food Insecurity: No Food Insecurity (05/14/2023)   Hunger Vital Sign    Worried About Running Out of Food in the Last Year: Never true    Ran Out of Food in the Last Year: Never true  Transportation Needs: No Transportation Needs (05/14/2023)   PRAPARE - Administrator, Civil Service (Medical): No    Lack of Transportation (Non-Medical): No  Physical Activity: Not on file  Stress: Not on file  Social Connections: Not on file    Family History  Problem Relation Age of Onset   Cerebral palsy Brother        1 brother has CP   Seizures Brother  Lung cancer Maternal Grandmother    Lung cancer Maternal Grandfather    Cancer Paternal Grandfather     Medications Prior to Admission  Medication Sig Dispense Refill Last Dose   escitalopram (LEXAPRO) 20 MG tablet Take 20 mg by mouth at bedtime.   05/13/2023 at 2200   Prenatal Multivit-Min-Fe-FA (PRENATAL, W/IRON & FA,) 27-0.8 MG TABS Take 1 tablet by mouth daily.   05/13/2023 at 2200   acetaminophen (TYLENOL) 500 MG tablet Take 1,000 mg by mouth every 6 (six) hours as needed.       No Known Allergies  Review of Systems: Negative except for what is mentioned in HPI.  Physical Exam: Ht 5\' 4"  (1.626 m)   Wt 99.8 kg   LMP 08/11/2022 (Exact Date)   BMI 37.76 kg/m  FHR by Doppler: 130 bpm CONSTITUTIONAL: Well-developed, well-nourished female in no acute distress.  HENT:  Normocephalic, atraumatic, External right and left ear normal.  Oropharynx is clear and moist EYES: Conjunctivae and EOM are normal. Pupils are equal, round, and reactive to light. No scleral icterus.  NECK: Normal range of motion, supple, no masses SKIN: Skin is warm and dry. No rash noted. Not diaphoretic. No erythema. No pallor. NEUROLGIC: Alert and oriented to person, place, and time. Normal reflexes, muscle tone coordination. No cranial nerve deficit noted. PSYCHIATRIC: Normal mood and affect. Normal behavior. Normal judgment and thought content. CARDIOVASCULAR: Normal heart rate noted, regular rhythm RESPIRATORY: Effort and breath sounds normal, no problems with respiration noted ABDOMEN: Soft, nontender, nondistended, gravid. Well-healed Pfannenstiel incision. PELVIC: Deferred MUSCULOSKELETAL: Normal range of motion. No edema and no tenderness. 2+ distal pulses.   Pertinent Labs/Studies:   Results for orders placed or performed during the hospital encounter of 05/14/23 (from the past 72 hour(s))  OB RESULTS CONSOLE RPR     Status: None   Collection Time: 05/13/23 12:00 AM  Result Value Ref Range   RPR Nonreactive     Assessment and Plan :Jenny Grant is a 22 y.o. G2P1011 at [redacted]w[redacted]d  being admitted being admitted for scheduled cesarean section. The risks of cesarean section discussed with the patient included but were not limited to: bleeding which may require transfusion or reoperation; infection which may require antibiotics; injury to bowel, bladder, ureters or other surrounding organs; injury to the fetus; need for additional procedures including hysterectomy in the event of a life-threatening hemorrhage; placental abnormalities wth subsequent pregnancies, incisional problems, thromboembolic phenomenon and other postoperative/anesthesia complications. The patient concurred with the proposed plan, giving informed written consent for the procedure. Patient has been NPO since last night she will remain NPO for procedure. Anesthesia and OR  aware. Preoperative prophylactic antibiotics and SCDs ordered on call to the OR. To OR when ready.

## 2023-05-08 ENCOUNTER — Encounter
Admission: RE | Admit: 2023-05-08 | Discharge: 2023-05-08 | Disposition: A | Discharge status: Home / Self Care | Payer: Medicaid Other | Source: Ambulatory Visit | Attending: Obstetrics and Gynecology | Admitting: Obstetrics and Gynecology

## 2023-05-08 ENCOUNTER — Other Ambulatory Visit: Payer: Self-pay

## 2023-05-08 DIAGNOSIS — Z01812 Encounter for preprocedural laboratory examination: Secondary | ICD-10-CM

## 2023-05-08 NOTE — Patient Instructions (Addendum)
Your procedure is scheduled on: 05/14/23 - Tuesday  Arrival Time: Please call Labor and Delivery the day before your scheduled C-Section to find out your arrival time. [phone].  Arrival: If your arrival time is prior to 6:00 am, please enter through the Emergency Room Entrance and you will be directed to Labor and Delivery. If your arrival time is 6:00 am or later, please enter the Medical Mall and follow the greeter's instructions.  REMEMBER: Instructions that are not followed completely may result in serious medical risk, up to and including death; or upon the discretion of your surgeon and anesthesiologist your surgery may need to be rescheduled.  Do not eat food or drink any liquids after midnight the night before surgery.  No gum chewing or hard candies.   One week prior to surgery: Stop Anti-inflammatories (NSAIDS) such as Advil, Aleve, Ibuprofen, Motrin, Naproxen, Naprosyn and Aspirin based products such as Excedrin, Goody's Powder, BC Powder.  Stop ANY OVER THE COUNTER supplements until after surgery.  You may however, continue to take Tylenol if needed for pain up until the day of surgery.  TAKE ONLY THESE MEDICATIONS THE MORNING OF SURGERY WITH A SIP OF WATER:  none   No Alcohol for 24 hours before or after surgery.  No Smoking including e-cigarettes for 24 hours prior to surgery.  No chewable tobacco products for at least 6 hours prior to surgery.  No nicotine patches on the day of surgery.  Do not use any "recreational" drugs for at least a week prior to your surgery.  Please be advised that the combination of cocaine and anesthesia may have negative outcomes, up to and including death. If you test positive for cocaine, your surgery will be cancelled.  On the morning of surgery brush your teeth with toothpaste and water, you may rinse your mouth with mouthwash if you wish. Do not swallow any toothpaste or mouthwash.  Use CHG wipes as directed on instruction  sheet.  Do not wear jewelry, make-up, hairpins, clips or nail polish.  Do not wear lotions, powders, or perfumes.   Do not shave body hair from the neck down 48 hours before surgery.  Contact lenses, hearing aids and dentures may not be worn into surgery.  Do not bring valuables to the hospital. [facility] is not responsible for any missing/lost belongings or valuables.   Notify your doctor if there is any change in your medical condition (cold, fever, infection).  Wear comfortable clothing (specific to your surgery type) to the hospital.  After surgery, you can help prevent lung complications by doing breathing exercises.  Take deep breaths and cough every 1-2 hours. Your doctor may order a device called an Incentive Spirometer to help you take deep breaths. When coughing or sneezing, hold a pillow firmly against your incision with both hands. This is called "splinting." Doing this helps protect your incision. It also decreases belly discomfort.  Please call the Pre-admissions Testing Dept. at [phone] if you have any questions about these instructions.  Surgery Visitation Policy:  Visitor Passes   All visitors, including children, need an identification sticker when visiting. These stickers must be worn where they can be seen.   Labor & Delivery  Laboring women may have one designated support person and two other visitors of any age visit. The support person must remain the same. The visitors may switch with other visitors. Visitation is permitted 24 hours per day. The designated support person or a visitor over the age of 19  may sleep overnight in the patient's room. A doula registered with [facility] for labor and delivery support is not considered a visitor. Doulas not registered with [facility] are considered visitors.  Mother Baby Unit, OB Specialty and Gynecological Care  A designated support person and three visitors of any age may visit. The three visitors  may switch out. The designated support person or a visitor age 27 or older may stay overnight in the room. During the postpartum period (up to 6 weeks), if the mother is the patient, she can have her newborn stay with her if there is another support person present who can be responsible for the baby.   Preparing the Skin Before Surgery     To help prevent the risk of infection at your surgical site, we are now providing you with rinse-free Sage 2% Chlorhexidine Gluconate (CHG) disposable wipes.  Chlorhexidine Gluconate (CHG) Soap  o An antiseptic cleaner that kills germs and bonds with the skin to continue killing germs even after washing  o Used for showering the night before surgery and morning of surgery  The night before surgery: Shower or bathe with warm water. Do not apply perfume, lotions, powders. Wait one hour after shower. Skin should be dry and cool. Open Sage wipe package - use 6 disposable cloths. Wipe body using one cloth for the right arm, one cloth for the left arm, one cloth for the right leg, one cloth for the left leg, one cloth for the chest/abdomen area, and one cloth for the back. Do not use on open wounds or sores. Do not use on face or genitals (private parts). If you are breast feeding, do not use on breasts. 5. Do not rinse, allow to dry. 6. Skin may feel "tacky" for several minutes. 7. Dress in clean clothes. 8. Place clean sheets on your bed and do not sleep with pets.  REPEAT ABOVE ON THE MORNING OF SURGERY BEFORE ARRIVING TO THE HOSPITAL.

## 2023-05-13 ENCOUNTER — Encounter
Admission: RE | Admit: 2023-05-13 | Discharge: 2023-05-13 | Disposition: A | Discharge status: Home / Self Care | Payer: Medicaid Other | Source: Ambulatory Visit | Attending: Obstetrics and Gynecology | Admitting: Obstetrics and Gynecology

## 2023-05-13 DIAGNOSIS — Z01812 Encounter for preprocedural laboratory examination: Secondary | ICD-10-CM | POA: Insufficient documentation

## 2023-05-13 DIAGNOSIS — Z3A39 39 weeks gestation of pregnancy: Secondary | ICD-10-CM

## 2023-05-13 LAB — CBC
HCT: 40.7 % (ref 36.0–46.0)
Hemoglobin: 13.2 g/dL (ref 12.0–15.0)
MCH: 28.5 pg (ref 26.0–34.0)
MCHC: 32.4 g/dL (ref 30.0–36.0)
MCV: 87.9 fL (ref 80.0–100.0)
Platelets: 226 10*3/uL (ref 150–400)
RBC: 4.63 MIL/uL (ref 3.87–5.11)
RDW: 15.9 % — ABNORMAL HIGH (ref 11.5–15.5)
WBC: 9 10*3/uL (ref 4.0–10.5)
nRBC: 0 % (ref 0.0–0.2)

## 2023-05-13 LAB — OB RESULTS CONSOLE RPR: RPR: NONREACTIVE

## 2023-05-13 LAB — HIV ANTIBODY (ROUTINE TESTING W REFLEX): HIV Screen 4th Generation wRfx: NONREACTIVE

## 2023-05-13 LAB — TYPE AND SCREEN
ABO/RH(D): A POS
Antibody Screen: NEGATIVE
Extend sample reason: UNDETERMINED

## 2023-05-14 ENCOUNTER — Inpatient Hospital Stay: Payer: Medicaid Other | Admitting: Anesthesiology

## 2023-05-14 ENCOUNTER — Inpatient Hospital Stay: Payer: Medicaid Other | Admitting: Urgent Care

## 2023-05-14 ENCOUNTER — Inpatient Hospital Stay
Admission: RE | Admit: 2023-05-14 | Discharge: 2023-05-15 | DRG: 788 | Disposition: A | Discharge status: Home / Self Care | Payer: Medicaid Other | Attending: Obstetrics and Gynecology | Admitting: Obstetrics and Gynecology

## 2023-05-14 ENCOUNTER — Other Ambulatory Visit: Payer: Self-pay

## 2023-05-14 ENCOUNTER — Encounter
Admission: RE | Disposition: A | Discharge status: Home / Self Care | Payer: Self-pay | Source: Home / Self Care | Attending: Obstetrics and Gynecology

## 2023-05-14 ENCOUNTER — Encounter: Payer: Self-pay | Admitting: Obstetrics and Gynecology

## 2023-05-14 DIAGNOSIS — Z3A39 39 weeks gestation of pregnancy: Secondary | ICD-10-CM

## 2023-05-14 DIAGNOSIS — O34211 Maternal care for low transverse scar from previous cesarean delivery: Principal | ICD-10-CM | POA: Diagnosis present

## 2023-05-14 DIAGNOSIS — O0993 Supervision of high risk pregnancy, unspecified, third trimester: Secondary | ICD-10-CM

## 2023-05-14 LAB — RPR: RPR Ser Ql: NONREACTIVE

## 2023-05-14 SURGERY — Surgical Case
Anesthesia: Spinal

## 2023-05-14 MED ORDER — SENNOSIDES-DOCUSATE SODIUM 8.6-50 MG PO TABS
2.0000 | ORAL_TABLET | ORAL | Status: DC
  Administered 2023-05-14: 2 via ORAL
  Filled 2023-05-14: qty 2

## 2023-05-14 MED ORDER — KETOROLAC TROMETHAMINE 30 MG/ML IJ SOLN: 30.0000 mg | Freq: Four times a day (QID) | INTRAMUSCULAR | Status: AC | PRN

## 2023-05-14 MED ORDER — OXYTOCIN-SODIUM CHLORIDE 30-0.9 UT/500ML-% IV SOLN: 2.5000 [IU]/h | INTRAVENOUS | Status: AC

## 2023-05-14 MED ORDER — ACETAMINOPHEN 500 MG PO TABS
1000.0000 mg | ORAL_TABLET | Freq: Four times a day (QID) | ORAL | Status: DC
  Administered 2023-05-14 – 2023-05-15 (×4): 1000 mg via ORAL
  Filled 2023-05-14 (×2): qty 2

## 2023-05-14 MED ORDER — TETANUS-DIPHTH-ACELL PERTUSSIS 5-2.5-18.5 LF-MCG/0.5 IM SUSY
0.5000 mL | PREFILLED_SYRINGE | Freq: Once | INTRAMUSCULAR | Status: DC
  Filled 2023-05-14: qty 0.5

## 2023-05-14 MED ORDER — BUPIVACAINE IN DEXTROSE 0.75-8.25 % IT SOLN
INTRATHECAL | Status: DC | PRN
  Administered 2023-05-14: 1.6 mL via INTRATHECAL

## 2023-05-14 MED ORDER — SODIUM CHLORIDE 0.9% FLUSH: 3.0000 mL | INTRAVENOUS | Status: DC | PRN

## 2023-05-14 MED ORDER — PHENYLEPHRINE 80 MCG/ML (10ML) SYRINGE FOR IV PUSH (FOR BLOOD PRESSURE SUPPORT)
PREFILLED_SYRINGE | INTRAVENOUS | Status: AC
  Filled 2023-05-14: qty 20

## 2023-05-14 MED ORDER — FLEET ENEMA 7-19 GM/118ML RE ENEM: 1.0000 | ENEMA | Freq: Every day | RECTAL | Status: DC | PRN

## 2023-05-14 MED ORDER — CEFAZOLIN SODIUM-DEXTROSE 2-4 GM/100ML-% IV SOLN
2.0000 g | INTRAVENOUS | Status: AC
  Administered 2023-05-14: 2 g via INTRAVENOUS
  Filled 2023-05-14: qty 100

## 2023-05-14 MED ORDER — GABAPENTIN 300 MG PO CAPS
300.0000 mg | ORAL_CAPSULE | Freq: Every day | ORAL | Status: DC
  Administered 2023-05-14: 300 mg via ORAL
  Filled 2023-05-14: qty 1

## 2023-05-14 MED ORDER — ONDANSETRON HCL 4 MG/2ML IJ SOLN: 4.0000 mg | Freq: Three times a day (TID) | INTRAMUSCULAR | Status: DC | PRN

## 2023-05-14 MED ORDER — NALOXONE HCL 4 MG/10ML IJ SOLN
1.0000 ug/kg/h | INTRAVENOUS | Status: DC | PRN
  Filled 2023-05-14: qty 5

## 2023-05-14 MED ORDER — MORPHINE SULFATE (PF) 0.5 MG/ML IJ SOLN
INTRAMUSCULAR | Status: DC | PRN
  Administered 2023-05-14: 150 ug via INTRATHECAL

## 2023-05-14 MED ORDER — ACETAMINOPHEN 500 MG PO TABS
1000.0000 mg | ORAL_TABLET | Freq: Four times a day (QID) | ORAL | Status: AC
  Filled 2023-05-14 (×2): qty 2

## 2023-05-14 MED ORDER — PHENYLEPHRINE HCL-NACL 20-0.9 MG/250ML-% IV SOLN
INTRAVENOUS | Status: DC | PRN
  Administered 2023-05-14: 30 ug/min via INTRAVENOUS

## 2023-05-14 MED ORDER — DIBUCAINE (PERIANAL) 1 % EX OINT: 1.0000 | TOPICAL_OINTMENT | CUTANEOUS | Status: DC | PRN

## 2023-05-14 MED ORDER — DIPHENHYDRAMINE HCL 50 MG/ML IJ SOLN
12.5000 mg | INTRAMUSCULAR | Status: DC | PRN
  Administered 2023-05-14: 12.5 mg via INTRAVENOUS
  Filled 2023-05-14: qty 1

## 2023-05-14 MED ORDER — OXYTOCIN-SODIUM CHLORIDE 30-0.9 UT/500ML-% IV SOLN
INTRAVENOUS | Status: AC
  Administered 2023-05-14: 2.5 [IU]/h via INTRAVENOUS
  Filled 2023-05-14: qty 500

## 2023-05-14 MED ORDER — MENTHOL 3 MG MT LOZG: 1.0000 | LOZENGE | OROMUCOSAL | Status: DC | PRN

## 2023-05-14 MED ORDER — SOD CITRATE-CITRIC ACID 500-334 MG/5ML PO SOLN
ORAL | Status: AC
  Filled 2023-05-14: qty 15

## 2023-05-14 MED ORDER — MEPERIDINE HCL 25 MG/ML IJ SOLN: 6.2500 mg | INTRAMUSCULAR | Status: DC | PRN

## 2023-05-14 MED ORDER — LACTATED RINGERS IV SOLN: INTRAVENOUS | Status: DC

## 2023-05-14 MED ORDER — DIPHENHYDRAMINE HCL 25 MG PO CAPS
25.0000 mg | ORAL_CAPSULE | Freq: Four times a day (QID) | ORAL | Status: DC | PRN
  Filled 2023-05-14: qty 1

## 2023-05-14 MED ORDER — ONDANSETRON HCL 4 MG/2ML IJ SOLN
INTRAMUSCULAR | Status: DC | PRN
  Administered 2023-05-14: 4 mg via INTRAVENOUS

## 2023-05-14 MED ORDER — WITCH HAZEL-GLYCERIN EX PADS: 1.0000 | MEDICATED_PAD | CUTANEOUS | Status: DC | PRN

## 2023-05-14 MED ORDER — POVIDONE-IODINE 10 % EX SWAB
2.0000 | Freq: Once | CUTANEOUS | Status: AC
  Administered 2023-05-14: 2 via TOPICAL

## 2023-05-14 MED ORDER — OXYCODONE HCL 5 MG PO TABS: 5.0000 mg | ORAL_TABLET | ORAL | Status: DC | PRN

## 2023-05-14 MED ORDER — MORPHINE SULFATE (PF) 0.5 MG/ML IJ SOLN
INTRAMUSCULAR | Status: AC
  Filled 2023-05-14: qty 10

## 2023-05-14 MED ORDER — NALOXONE HCL 0.4 MG/ML IJ SOLN: 0.4000 mg | INTRAMUSCULAR | Status: DC | PRN

## 2023-05-14 MED ORDER — BUPIVACAINE HCL (PF) 0.25 % IJ SOLN
INTRAMUSCULAR | Status: DC | PRN
  Administered 2023-05-14: 60 mL

## 2023-05-14 MED ORDER — OXYTOCIN-SODIUM CHLORIDE 30-0.9 UT/500ML-% IV SOLN
INTRAVENOUS | Status: AC
  Filled 2023-05-14: qty 500

## 2023-05-14 MED ORDER — KETOROLAC TROMETHAMINE 30 MG/ML IJ SOLN
30.0000 mg | Freq: Four times a day (QID) | INTRAMUSCULAR | Status: AC
  Administered 2023-05-14 – 2023-05-15 (×4): 30 mg via INTRAVENOUS
  Filled 2023-05-14 (×4): qty 1

## 2023-05-14 MED ORDER — MEASLES, MUMPS & RUBELLA VAC IJ SOLR
0.5000 mL | Freq: Once | INTRAMUSCULAR | Status: DC
  Filled 2023-05-14: qty 0.5

## 2023-05-14 MED ORDER — FERROUS SULFATE 325 (65 FE) MG PO TABS
325.0000 mg | ORAL_TABLET | Freq: Two times a day (BID) | ORAL | Status: DC
  Administered 2023-05-14: 325 mg via ORAL
  Filled 2023-05-14: qty 1

## 2023-05-14 MED ORDER — COCONUT OIL OIL
1.0000 | TOPICAL_OIL | Status: DC | PRN
  Filled 2023-05-14: qty 7.5

## 2023-05-14 MED ORDER — CHLORHEXIDINE GLUCONATE 0.12 % MT SOLN
15.0000 mL | Freq: Once | OROMUCOSAL | Status: AC
  Administered 2023-05-14: 15 mL via OROMUCOSAL
  Filled 2023-05-14: qty 15

## 2023-05-14 MED ORDER — SIMETHICONE 80 MG PO CHEW: 80.0000 mg | CHEWABLE_TABLET | ORAL | Status: DC | PRN

## 2023-05-14 MED ORDER — BUPIVACAINE HCL (PF) 0.25 % IJ SOLN
INTRAMUSCULAR | Status: AC
  Filled 2023-05-14: qty 60

## 2023-05-14 MED ORDER — ESCITALOPRAM OXALATE 10 MG PO TABS
20.0000 mg | ORAL_TABLET | Freq: Every day | ORAL | Status: DC
  Administered 2023-05-14: 20 mg via ORAL
  Filled 2023-05-14: qty 1
  Filled 2023-05-14: qty 2

## 2023-05-14 MED ORDER — LACTATED RINGERS IV SOLN: Freq: Once | INTRAVENOUS | Status: AC

## 2023-05-14 MED ORDER — FENTANYL CITRATE (PF) 100 MCG/2ML IJ SOLN
INTRAMUSCULAR | Status: DC | PRN
  Administered 2023-05-14: 10 ug via INTRATHECAL

## 2023-05-14 MED ORDER — IBUPROFEN 600 MG PO TABS: 600.0000 mg | ORAL_TABLET | Freq: Four times a day (QID) | ORAL | Status: DC

## 2023-05-14 MED ORDER — OXYCODONE HCL 5 MG PO TABS: 5.0000 mg | ORAL_TABLET | Freq: Four times a day (QID) | ORAL | Status: DC | PRN

## 2023-05-14 MED ORDER — KETOROLAC TROMETHAMINE 30 MG/ML IJ SOLN
INTRAMUSCULAR | Status: DC | PRN
  Administered 2023-05-14: 30 mg via INTRAVENOUS

## 2023-05-14 MED ORDER — ORAL CARE MOUTH RINSE: 15.0000 mL | Freq: Once | OROMUCOSAL | Status: AC

## 2023-05-14 MED ORDER — OXYTOCIN-SODIUM CHLORIDE 30-0.9 UT/500ML-% IV SOLN
INTRAVENOUS | Status: DC | PRN
  Administered 2023-05-14: 30 [IU] via INTRAVENOUS

## 2023-05-14 MED ORDER — PHENYLEPHRINE HCL (PRESSORS) 10 MG/ML IV SOLN
INTRAVENOUS | Status: DC | PRN
  Administered 2023-05-14: 80 ug via INTRAVENOUS

## 2023-05-14 MED ORDER — PRENATAL MULTIVITAMIN CH
1.0000 | ORAL_TABLET | Freq: Every day | ORAL | Status: DC
  Administered 2023-05-15: 1 via ORAL
  Filled 2023-05-14: qty 1

## 2023-05-14 MED ORDER — SIMETHICONE 80 MG PO CHEW
80.0000 mg | CHEWABLE_TABLET | Freq: Three times a day (TID) | ORAL | Status: DC
  Administered 2023-05-14 – 2023-05-15 (×2): 80 mg via ORAL
  Filled 2023-05-14 (×2): qty 1

## 2023-05-14 MED ORDER — SCOPOLAMINE 1 MG/3DAYS TD PT72: 1.0000 | MEDICATED_PATCH | Freq: Once | TRANSDERMAL | Status: DC

## 2023-05-14 MED ORDER — SOD CITRATE-CITRIC ACID 500-334 MG/5ML PO SOLN
30.0000 mL | ORAL | Status: AC
  Administered 2023-05-14: 30 mL via ORAL

## 2023-05-14 MED ORDER — DIPHENHYDRAMINE HCL 25 MG PO CAPS: 25.0000 mg | ORAL_CAPSULE | ORAL | Status: DC | PRN

## 2023-05-14 MED ORDER — BISACODYL 10 MG RE SUPP: 10.0000 mg | Freq: Every day | RECTAL | Status: DC | PRN

## 2023-05-14 MED ORDER — FENTANYL CITRATE (PF) 100 MCG/2ML IJ SOLN
INTRAMUSCULAR | Status: AC
  Filled 2023-05-14: qty 2

## 2023-05-14 SURGICAL SUPPLY — 34 items
APL PRP STRL LF DISP 70% ISPRP (MISCELLANEOUS) ×1
APL SKNCLS STERI-STRIP NONHPOA (GAUZE/BANDAGES/DRESSINGS) ×1
BARRIER ADHS 3X4 INTERCEED (GAUZE/BANDAGES/DRESSINGS) IMPLANT
BENZOIN TINCTURE PRP APPL 2/3 (GAUZE/BANDAGES/DRESSINGS) IMPLANT
BRR ADH 4X3 ABS CNTRL BYND (GAUZE/BANDAGES/DRESSINGS) ×1
CHLORAPREP W/TINT 26 (MISCELLANEOUS) ×1 IMPLANT
DRSG TELFA 3X8 NADH STRL (GAUZE/BANDAGES/DRESSINGS) ×1 IMPLANT
ELECT REM PT RETURN 9FT ADLT (ELECTROSURGICAL) ×1
ELECTRODE REM PT RTRN 9FT ADLT (ELECTROSURGICAL) ×1 IMPLANT
EXTRACTOR VACUUM KIWI (MISCELLANEOUS) IMPLANT
GAUZE SPONGE 4X4 12PLY STRL (GAUZE/BANDAGES/DRESSINGS) ×1 IMPLANT
GOWN STRL REUS W/ TWL LRG LVL3 (GOWN DISPOSABLE) ×3 IMPLANT
GOWN STRL REUS W/TWL LRG LVL3 (GOWN DISPOSABLE) ×3
MANIFOLD NEPTUNE II (INSTRUMENTS) ×1 IMPLANT
MAT PREVALON FULL STRYKER (MISCELLANEOUS) ×1 IMPLANT
NDL HYPO 25GX1X1/2 BEV (NEEDLE) ×1 IMPLANT
NEEDLE HYPO 25GX1X1/2 BEV (NEEDLE) ×1 IMPLANT
NS IRRIG 1000ML POUR BTL (IV SOLUTION) ×1 IMPLANT
PACK C SECTION AR (MISCELLANEOUS) ×1 IMPLANT
PAD OB MATERNITY 4.3X12.25 (PERSONAL CARE ITEMS) ×1 IMPLANT
PAD PREP OB/GYN DISP 24X41 (PERSONAL CARE ITEMS) ×1 IMPLANT
SCRUB CHG 4% DYNA-HEX 4OZ (MISCELLANEOUS) ×1 IMPLANT
STRIP CLOSURE SKIN 1/2X4 (GAUZE/BANDAGES/DRESSINGS) IMPLANT
SUT MNCRL 4-0 (SUTURE) ×1
SUT MNCRL 4-0 27XMFL (SUTURE) ×1
SUT VIC AB 0 CT1 36 (SUTURE) ×2 IMPLANT
SUT VIC AB 0 CTX 36 (SUTURE) ×2
SUT VIC AB 0 CTX36XBRD ANBCTRL (SUTURE) ×2 IMPLANT
SUT VIC AB 2-0 SH 27 (SUTURE) ×2
SUT VIC AB 2-0 SH 27XBRD (SUTURE) ×2 IMPLANT
SUTURE MNCRL 4-0 27XMF (SUTURE) ×1 IMPLANT
SYR 30ML LL (SYRINGE) ×2 IMPLANT
TRAP FLUID SMOKE EVACUATOR (MISCELLANEOUS) ×1 IMPLANT
WATER STERILE IRR 500ML POUR (IV SOLUTION) ×1 IMPLANT

## 2023-05-14 NOTE — Discharge Summary (Signed)
Obstetrical Discharge Summary  Patient Name: Jenny Grant DOB: 23-Oct-2001 MRN: [id number]  Date of Admission: 05/14/2023 Date of Delivery: 05/14/23 Delivered by: Christeen Douglas, MD MPH Date of Discharge: 05/15/2023  Primary OB: [facility] [id number] last menstrual period was 08/11/2022 (exact date). EDC Estimated Date of Delivery: 05/18/23 Gestational Age at Delivery: [redacted]w[redacted]d   Antepartum complications:  Migraines 2. Prior cesarean section Depression/anxiety  Admitting Diagnosis: Supervision of high risk pregnancy in third trimester [O09.93]  Secondary Diagnosis: Patient Active Problem List   Diagnosis Date Noted   Supervision of high risk pregnancy in third trimester 05/14/2023   Pregnancy 08/19/2021   Amniotic fluid leaking 08/18/2021   Migraine without aura and without status migrainosus, not intractable 05/12/2014   Tension type headache 05/12/2014   Anxiety state, unspecified 05/12/2014   Depressed mood 05/12/2014    Discharge Diagnosis: Term Pregnancy Delivered       Delivery Type: repeat cesarean section, low transverse incision Anesthesia: spinal anesthesia Placenta: manual removal To Pathology: No  Laceration: n/a Episiotomy: none Newborn Data: Live born female  Birth Weight: 8 lb 5 oz (3770 g) APGAR: 8, 9  Newborn Delivery   Birth date/time: 05/14/2023 13:01:00 Delivery type: C-Section, Low Transverse Trial of labor: No C-section categorization: Repeat      Postpartum Procedures: none Edinburgh:     05/14/2023    4:43 PM 08/20/2021    1:00 AM  Edinburgh Postnatal Depression Scale Screening Tool  I have been able to laugh and see the funny side of things. 0 0  I have looked forward with enjoyment to things. 0 0  I have blamed myself unnecessarily when things went wrong. 1 1  I have been anxious or worried for no good reason. 2 1  I have felt scared or panicky for no good reason. 0 0  Things have been getting on top of me. 0 1  I  have been so unhappy that I have had difficulty sleeping. 0 0  I have felt sad or miserable. 0 0  I have been so unhappy that I have been crying. 0 0  The thought of harming myself has occurred to me. 0 0  Edinburgh Postnatal Depression Scale Total 3 3     Post partum course:  (Cesarean Section):  Patient had an uncomplicated postpartum course.  By time of discharge on POD#1, her pain was controlled on oral pain medications; she had appropriate lochia and was ambulating, voiding without difficulty, tolerating regular diet and passing flatus.   She was deemed stable for discharge to home.    Discharge Physical Exam:  BP (!) 111/58 (BP Location: Left Arm)   Pulse 73   Temp 98.4 F (36.9 C) (Oral)   Resp 18   Ht 5\' 4"  (1.626 m)   Wt 99.8 kg   LMP 08/11/2022 (Exact Date)   SpO2 99%   Breastfeeding Unknown   BMI 37.76 kg/m   General: NAD CV: RRR Pulm: CTABL, nl effort ABD: s/nd/nt, fundus firm and below the umbilicus Lochia: moderate Incision: c/d/I, covered with occlusive OP site dressing  DVT Evaluation: LE non-ttp, no evidence of DVT on exam.  Hemoglobin  Date Value Ref Range Status  05/15/2023 12.1 12.0 - 15.0 g/dL Final   HGB  Date Value Ref Range Status  12/05/2012 13.6 12.0 - 16.0 g/dL Final   HCT  Date Value Ref Range Status  05/15/2023 36.5 36.0 - 46.0 % Final  12/05/2012 40.3 35.0 - 47.0 % Final  Risk assessment for postpartum VTE and prophylactic treatment: Very high risk factors: None High risk factors: None Moderate risk factors: Cesarean delivery  and BMI 30-40 kg/m2  Postpartum VTE prophylaxis with LMWH not indicated  Disposition: stable, discharge to home. Baby Feeding: breast feeding Baby Disposition: home with mom  Rh Immune globulin indicated: No Rubella vaccine given: was not indicated Varivax vaccine given: was not indicated Flu vaccine given in AP setting: Yes  and No  Contraception:  undecided  Prenatal Labs:  ABO, Rh: --/--/A POS  (06/10 1002) Antibody: NEG (06/10 1002) Rubella: Immune, Immune (12/07 0000) RPR: NON REACTIVE (06/10 1002)  HBsAg: Negative, Negative (12/07 0000)  HIV: Non Reactive (06/10 1002)  ZOX:WRUEAVWU, Negative/-- (05/24 0000) 1 hr Glucola  124 Genetic screening normal Anatomy US normal   Plan:  Jenny Grant was discharged to home in good condition. Follow-up appointment with delivering provider in 6 weeks.Postop visit in 2 weeks  Discharge Medications: Allergies as of 05/15/2023   No Known Allergies      Medication List     TAKE these medications    acetaminophen 500 MG tablet Commonly known as: TYLENOL Take 2 tablets (1,000 mg total) by mouth every 6 (six) hours. What changed:  when to take this reasons to take this   coconut oil Oil Apply 1 Application topically as needed.   dibucaine 1 % Oint Commonly known as: NUPERCAINAL Place 1 Application rectally as needed (postpartum hemorrhoids).   escitalopram 20 MG tablet Commonly known as: LEXAPRO Take 20 mg by mouth at bedtime.   ferrous sulfate 325 (65 FE) MG tablet Take 1 tablet (325 mg total) by mouth 2 (two) times daily with a meal.   ibuprofen 600 MG tablet Commonly known as: ADVIL Take 1 tablet (600 mg total) by mouth every 6 (six) hours.   oxyCODONE 5 MG immediate release tablet Commonly known as: Oxy IR/ROXICODONE Take 1-2 tablets (5-10 mg total) by mouth every 4 (four) hours as needed for moderate pain.   Prenatal (w/Iron & FA) 27-0.8 MG Tabs Take 1 tablet by mouth daily.   senna-docusate 8.6-50 MG tablet Commonly known as: Senokot-S Take 2 tablets by mouth daily.   simethicone 80 MG chewable tablet Commonly known as: MYLICON Chew 1 tablet (80 mg total) by mouth as needed for flatulence.   witch hazel-glycerin pad Commonly known as: TUCKS Apply 1 Application topically as needed for hemorrhoids.         Follow-up Information     Christeen Douglas, MD Follow up in 2 week(s).    Specialty: Obstetrics and Gynecology Why: may f/u with Margaretmary Eddy for postop check if desired, For postop check Contact information: [address] [place] [address] [phone]                 Signed:  Chari Manning CNM

## 2023-05-14 NOTE — Anesthesia Preprocedure Evaluation (Signed)
Anesthesia Evaluation  Patient identified by MRN, date of birth, ID band Patient awake    Reviewed: Allergy & Precautions, H&P , NPO status , Patient's Chart, lab work & pertinent test results, reviewed documented beta blocker date and time   History of Anesthesia Complications Negative for: history of anesthetic complications  Airway Mallampati: II  TM Distance: >3 FB Neck ROM: full    Dental  (+) Dental Advidsory Given, Teeth Intact   Pulmonary neg pulmonary ROS, neg shortness of breath   Pulmonary exam normal breath sounds clear to auscultation       Cardiovascular Exercise Tolerance: Good (-) angina negative cardio ROS Normal cardiovascular exam(-) dysrhythmias  Rhythm:regular Rate:Normal     Neuro/Psych  Headaches, neg Seizures PSYCHIATRIC DISORDERS Anxiety Depression       GI/Hepatic Neg liver ROS,GERD  ,,  Endo/Other  negative endocrine ROS    Renal/GU negative Renal ROS  negative genitourinary   Musculoskeletal   Abdominal   Peds  Hematology negative hematology ROS (+)   Anesthesia Other Findings Past Medical History: No date: Anxiety No date: Depression No date: Headache(784.0) No date: Migraines   Reproductive/Obstetrics (+) Pregnancy                             Anesthesia Physical Anesthesia Plan  ASA: 2  Anesthesia Plan: Spinal   Post-op Pain Management:    Induction:   PONV Risk Score and Plan: 3 and Ondansetron, TIVA and Dexamethasone  Airway Management Planned: Natural Airway and Nasal Cannula  Additional Equipment:   Intra-op Plan:   Post-operative Plan:   Informed Consent: I have reviewed the patients History and Physical, chart, labs and discussed the procedure including the risks, benefits and alternatives for the proposed anesthesia with the patient or authorized representative who has indicated his/her understanding and acceptance.     Dental  Advisory Given  Plan Discussed with: Anesthesiologist, CRNA and Surgeon  Anesthesia Plan Comments: (Patient reports no bleeding problems and no anticoagulant use.  Plan for spinal with backup GA  Patient consented for risks of anesthesia including but not limited to:  - adverse reactions to medications - damage to eyes, teeth, lips or other oral mucosa - nerve damage due to positioning  - risk of bleeding, infection and or nerve damage from spinal that could lead to paralysis - risk of headache or failed spinal - damage to teeth, lips or other oral mucosa - sore throat or hoarseness - damage to heart, brain, nerves, lungs, other parts of body or loss of life  Patient voiced understanding.)        Anesthesia Quick Evaluation

## 2023-05-14 NOTE — Plan of Care (Signed)
Progressing through post-partum course

## 2023-05-14 NOTE — Transfer of Care (Signed)
Immediate Anesthesia Transfer of Care Note  Patient: Jenny Grant  Procedure(s) Performed: REPEAT CESAREAN SECTION  Patient Location: Mother/Baby  Anesthesia Type:Spinal  Level of Consciousness: awake, alert , and patient cooperative  Airway & Oxygen Therapy: Patient Spontanous Breathing  Post-op Assessment: Report given to RN and Post -op Vital signs reviewed and stable  Post vital signs: Reviewed and stable  Last Vitals:  Vitals Value Taken Time  BP 117/71 05/14/23 1403  Temp 36.1 C 05/14/23 1403  Pulse 71 05/14/23 1403  Resp 14 05/14/23 1403  SpO2 99 % 05/14/23 1403    Last Pain:  Vitals:   05/14/23 1403  TempSrc: Tympanic         Complications: No notable events documented.

## 2023-05-14 NOTE — Op Note (Signed)
Cesarean Section Procedure Note  Date of procedure: 05/14/2023   Pre-operative Diagnosis: Intrauterine pregnancy at [redacted]w[redacted]d;  - prior cesarean section  Post-operative Diagnosis: same, delivered.  Procedure: Repeat Low Transverse Cesarean Section through Pfannenstiel incision  Surgeon: Christeen Douglas, MD  Assistant(s):  Margaretmary Eddy, CNM   An experienced assistant was required given the standard of surgical care given the complexity of the case.  This assistant was needed for exposure, dissection, suctioning, retraction, instrument exchange,  CNM assisting with delivery with administration of fundal pressure, and for overall help during the procedure.   Anesthesia: Spinal anesthesia  Anesthesiologist: Louie Boston, MD Anesthesiologist: Louie Boston, MD CRNA: Mohammed Kindle, CRNA; Henrietta Hoover, CRNA  Estimated Blood Loss:   555         Drains: Foley         Total IV Fluids:  Urine Output:         Specimens: none         Complications:  None; patient tolerated the procedure well.         Disposition: PACU - hemodynamically stable.         Condition: stable  Findings:  A female infant in cephalic OP presentation. Amniotic fluid - Other yellow, cloudy  Birth weight 3740 g.   APGAR (1 MIN): 8   APGAR (5 MINS): 9   APGAR (10 MINS):     Intact placenta with a three-vessel cord.  Grossly normal uterus, tubes and ovaries bilaterally. Tight and firm intraabdominal adhesions were noted.  Indications:  previous c/s  Procedure Details  The patient was taken to Operating Room, identified as the correct patient and the procedure verified as C-Section Delivery. A formal Time Out was held with all team members present and in agreement.  After induction of anesthesia, the patient was draped and prepped in the usual sterile manner. A Pfannenstiel skin incision was made and carried down through the subcutaneous tissue to the fascia. Fascial incision was made  and extended transversely with the Mayo scissors. The fascia was separated from the underlying rectus tissue superiorly and inferiorly. The peritoneum was identified and entered bluntly. Tight bands of dense scar tissue on the anterior uterus were doubly clamped and cut x2, and tied. Peritoneal incision was extended longitudinally, but was difficult to find. The utero-vesical peritoneal reflection was incised transversely and a bladder flap was attempted without success.  A low transverse hysterotomy was made. The fetus was delivered atraumatically. The umbilical cord was clamped x2 and cut and the infant was handed to the awaiting pediatricians. The placenta was removed intact and appeared normal, intact, and with a 3-vessel cord.   The uterus was unable to be exteriorized, and it remained insitu. It was cleared of all clot and debris. The hysterotomy was closed with running sutures of 0-Vicryl. A second imbricating layer was placed with the same suture. Excellent hemostasis was observed. The peritoneal cavity was cleared of all clots and debris.   Gutters were not accessible, but the pelvis was evaluated and excellent hemostasis was noted. The fascia was then reapproximated with running sutures of 0 Maxon.  The subcutaneous tissue was reapproximated with running sutures of 0 Vicryl. The skin was reapproximated with Ensorb absorbable staples. 30 of 0.25% bupivicaine in 50ml of NSS was placed in the fascial and skin lines.  Instrument, sponge, and needle counts were correct prior to the abdominal closure and at the conclusion of the case.   The patient tolerated the procedure well  and was transferred to the recovery room in stable condition.   Christeen Douglas, MD 05/14/2023

## 2023-05-14 NOTE — Lactation Note (Signed)
This note was copied from a baby's chart. Lactation Consultation Note  Patient Name: Jenny Grant Today's Date: 05/14/2023 Age:24 hours Reason for consult: L&D Initial assessment;Term;Mother's request;RN request;Breastfeeding assistance   Maternal Data This is mom's 2nd baby, repeat C/S. Mom with history of depression, migraines, and anxiety. Mom is an experienced breastfeeding mother. Mom reports she had an abundant milk supply with her 1st baby. She had also done a lot of pumping in addition to breastfeeding. Her plan this time is to breastfeed and not to pump as much as she did.  Requested by care nurse to assist mom with 1st feeding. Has patient been taught Hand Expression?: Yes Does the patient have breastfeeding experience prior to this delivery?: Yes How long did the patient breastfeed?: >than 1 year  Feeding Mother's Current Feeding Choice: Breast Milk Assisted mom with maximizing position and latch technique. Baby was eager to feed and readily latched. Multiple audible swallows could be heard by mom and LC. LATCH Score Latch: Grasps breast easily, tongue down, lips flanged, rhythmical sucking. (Simultaneous filing. User may not have seen previous data.)  Audible Swallowing: Spontaneous and intermittent (Simultaneous filing. User may not have seen previous data.)  Type of Nipple: Everted at rest and after stimulation (Simultaneous filing. User may not have seen previous data.)  Comfort (Breast/Nipple): Soft / non-tender (Simultaneous filing. User may not have seen previous data.)  Hold (Positioning): Assistance needed to correctly position infant at breast and maintain latch. (Simultaneous filing. User may not have seen previous data.)  LATCH Score: 9 (Simultaneous filing. User may not have seen previous data.)   Lactation Tools Discussed/Used    Interventions Interventions: Breast feeding basics reviewed;Assisted with latch;Breast massage;Hand express;Breast  compression;Adjust position;Support pillows;Position options;Education  Discharge Pump: Personal  Consult Status Consult Status: Follow-up from L&D  Update provided to care nurse.  Fuller Song 05/14/2023, 3:45 PM

## 2023-05-14 NOTE — Anesthesia Procedure Notes (Signed)
Spinal  Patient location during procedure: OR Start time: 05/14/2023 12:10 PM End time: 05/14/2023 12:18 PM Reason for block: surgical anesthesia Staffing Performed: resident/CRNA  Anesthesiologist: Louie Boston, MD Resident/CRNA: Henrietta Hoover, CRNA Performed by: Henrietta Hoover, CRNA Authorized by: Louie Boston, MD   Preanesthetic Checklist Completed: patient identified, IV checked, site marked, risks and benefits discussed, surgical consent, monitors and equipment checked, pre-op evaluation and timeout performed Spinal Block Patient position: sitting Prep: ChloraPrep Patient monitoring: heart rate, cardiac monitor, continuous pulse ox and blood pressure Approach: midline Location: L3-4 Injection technique: single-shot Needle Needle type: Sprotte  Needle gauge: 24 G Needle length: 9 cm Assessment Sensory level: T4 Events: CSF return Additional Notes Pt stable throughout.  No complications noted.

## 2023-05-15 ENCOUNTER — Encounter: Payer: Self-pay | Admitting: Obstetrics and Gynecology

## 2023-05-15 LAB — CBC
HCT: 36.5 % (ref 36.0–46.0)
Hemoglobin: 12.1 g/dL (ref 12.0–15.0)
MCH: 29.3 pg (ref 26.0–34.0)
MCHC: 33.2 g/dL (ref 30.0–36.0)
MCV: 88.4 fL (ref 80.0–100.0)
Platelets: 191 10*3/uL (ref 150–400)
RBC: 4.13 MIL/uL (ref 3.87–5.11)
RDW: 15.9 % — ABNORMAL HIGH (ref 11.5–15.5)
WBC: 10.2 10*3/uL (ref 4.0–10.5)
nRBC: 0 % (ref 0.0–0.2)

## 2023-05-15 MED ORDER — SENNOSIDES-DOCUSATE SODIUM 8.6-50 MG PO TABS: 2.0000 | ORAL_TABLET | ORAL | Status: DC

## 2023-05-15 MED ORDER — IBUPROFEN 600 MG PO TABS: 600.0000 mg | ORAL_TABLET | Freq: Four times a day (QID) | ORAL | 0 refills | Status: AC

## 2023-05-15 MED ORDER — OXYCODONE HCL 5 MG PO TABS: 5.0000 mg | ORAL_TABLET | ORAL | 0 refills | Status: DC | PRN

## 2023-05-15 MED ORDER — ACETAMINOPHEN 500 MG PO TABS: 1000.0000 mg | ORAL_TABLET | Freq: Four times a day (QID) | ORAL | 0 refills | Status: AC

## 2023-05-15 MED ORDER — COCONUT OIL OIL: 1.0000 | TOPICAL_OIL | 0 refills | Status: DC | PRN

## 2023-05-15 MED ORDER — WITCH HAZEL-GLYCERIN EX PADS: 1.0000 | MEDICATED_PAD | CUTANEOUS | 12 refills | Status: DC | PRN

## 2023-05-15 MED ORDER — SIMETHICONE 80 MG PO CHEW: 80.0000 mg | CHEWABLE_TABLET | ORAL | 0 refills | Status: DC | PRN

## 2023-05-15 MED ORDER — DIBUCAINE (PERIANAL) 1 % EX OINT: 1.0000 | TOPICAL_OINTMENT | CUTANEOUS | Status: DC | PRN

## 2023-05-15 MED ORDER — FERROUS SULFATE 325 (65 FE) MG PO TABS: 325.0000 mg | ORAL_TABLET | Freq: Two times a day (BID) | ORAL | 3 refills | Status: DC

## 2023-05-15 NOTE — Anesthesia Postprocedure Evaluation (Signed)
Anesthesia Post Note  Patient: Namrata Dangler  Procedure(s) Performed: REPEAT CESAREAN SECTION  Patient location during evaluation: Mother Baby Anesthesia Type: Spinal Level of consciousness: oriented and awake and alert Pain management: pain level controlled Vital Signs Assessment: post-procedure vital signs reviewed and stable Respiratory status: spontaneous breathing and respiratory function stable Cardiovascular status: blood pressure returned to baseline and stable Postop Assessment: no headache, no backache, no apparent nausea or vomiting and able to ambulate Anesthetic complications: no  No notable events documented.   Last Vitals:  Vitals:   05/14/23 2316 05/15/23 0316  BP: 110/72 115/63  Pulse: 78 81  Resp: 18 18  Temp: 37.1 C 37.1 C  SpO2: 98% 98%    Last Pain:  Vitals:   05/15/23 0316  TempSrc: Oral  PainSc:                  [name]

## 2023-05-15 NOTE — Progress Notes (Signed)
Postop Day  1  Subjective: no complaints, up ad lib, voiding, tolerating PO, and + flatus  Doing well, no concerns. Ambulating without difficulty, pain managed with PO meds, tolerating regular diet, and voiding without difficulty.   No fever/chills, chest pain, shortness of breath, nausea/vomiting, or leg pain. No nipple or breast pain. No headache, visual changes, or RUQ/epigastric pain.  Objective: BP (!) 111/58 (BP Location: Left Arm)   Pulse 73   Temp 98.4 F (36.9 C) (Oral)   Resp 18   Ht 5\' 4"  (1.626 m)   Wt 99.8 kg   LMP 08/11/2022 (Exact Date)   SpO2 99%   Breastfeeding Unknown   BMI 37.76 kg/m    Physical Exam:  General: alert, cooperative, and appears stated age Breasts: soft/nontender CV: RRR Pulm: nl effort, CTABL Abdomen: soft, non-tender, active bowel sounds Uterine Fundus: firm Incision: healing well, no significant drainage, no dehiscence, no significant erythema Perineum: minimal edema, intact Lochia: appropriate DVT Evaluation: No evidence of DVT seen on physical exam. Negative Homan's sign. No cords or calf tenderness. No significant calf/ankle edema.  Recent Labs    05/13/23 1002 05/15/23 0539  HGB 13.2 12.1  HCT 40.7 36.5  WBC 9.0 10.2  PLT 226 191    Assessment/Plan: 22 y.o. [id number] postpartum day # 1  -Continue routine postpartum care -Lactation consult PRN for breastfeeding  -Discussed contraceptive options including implant, IUDs hormonal and non-hormonal, injection, pills/ring/patch, condoms, and NFP.  -stool softeners  -Immunization status:   all immunizations up to date   Disposition: Continue inpatient postpartum care   LOS: 1 day   Nicie Milan Wonda Amis, CNM 05/15/2023, 10:51 AM   ----- Chari Manning Certified Nurse Midwife [facility]

## 2023-05-15 NOTE — Anesthesia Post-op Follow-up Note (Signed)
  Anesthesia Pain Follow-up Note  Patient: Jenny Grant  Day #: 1  Date of Follow-up: 05/15/2023 Time: 7:17 AM  Last Vitals:  Vitals:   05/14/23 2316 05/15/23 0316  BP: 110/72 115/63  Pulse: 78 81  Resp: 18 18  Temp: 37.1 C 37.1 C  SpO2: 98% 98%    Level of Consciousness: alert  Pain: none   Side Effects:None  Catheter Site Exam:clean, dry     Plan: D/C from anesthesia care at surgeon's request  [name]

## 2023-06-03 ENCOUNTER — Telehealth: Payer: Self-pay

## 2023-06-03 NOTE — Telephone Encounter (Signed)

## 2024-09-24 ENCOUNTER — Other Ambulatory Visit: Payer: Self-pay | Admitting: Sports Medicine

## 2024-09-24 DIAGNOSIS — M7542 Impingement syndrome of left shoulder: Secondary | ICD-10-CM

## 2024-09-24 DIAGNOSIS — M898X1 Other specified disorders of bone, shoulder: Secondary | ICD-10-CM

## 2024-09-24 DIAGNOSIS — G8929 Other chronic pain: Secondary | ICD-10-CM

## 2024-09-25 ENCOUNTER — Encounter: Payer: Self-pay | Admitting: Radiology

## 2024-09-25 ENCOUNTER — Ambulatory Visit
Admission: RE | Admit: 2024-09-25 | Discharge: 2024-09-25 | Disposition: A | Discharge status: Home / Self Care | Source: Ambulatory Visit | Attending: Sports Medicine | Admitting: Sports Medicine

## 2024-09-25 DIAGNOSIS — M25512 Pain in left shoulder: Secondary | ICD-10-CM | POA: Diagnosis present

## 2024-09-25 DIAGNOSIS — G8929 Other chronic pain: Secondary | ICD-10-CM | POA: Insufficient documentation

## 2024-09-25 DIAGNOSIS — M7542 Impingement syndrome of left shoulder: Secondary | ICD-10-CM | POA: Insufficient documentation

## 2024-09-25 DIAGNOSIS — M898X1 Other specified disorders of bone, shoulder: Secondary | ICD-10-CM | POA: Insufficient documentation

## 2024-12-15 ENCOUNTER — Other Ambulatory Visit: Payer: Self-pay | Admitting: Orthopedic Surgery

## 2024-12-16 ENCOUNTER — Other Ambulatory Visit: Payer: Self-pay

## 2024-12-16 ENCOUNTER — Other Ambulatory Visit: Payer: Self-pay | Admitting: Orthopedic Surgery

## 2024-12-16 ENCOUNTER — Encounter
Admission: RE | Admit: 2024-12-16 | Discharge: 2024-12-16 | Disposition: A | Discharge status: Home / Self Care | Source: Ambulatory Visit | Attending: Orthopedic Surgery | Admitting: Orthopedic Surgery

## 2024-12-16 DIAGNOSIS — M25512 Pain in left shoulder: Secondary | ICD-10-CM

## 2024-12-16 DIAGNOSIS — Z01812 Encounter for preprocedural laboratory examination: Secondary | ICD-10-CM

## 2024-12-16 HISTORY — DX: Pain in left shoulder: M25.512

## 2024-12-16 HISTORY — DX: Pneumonia, unspecified organism: J18.9

## 2024-12-16 NOTE — Patient Instructions (Addendum)
 Your procedure is scheduled on: 12/18/24 - Friday Report to the Registration Desk on the 1st floor of the [facility]. To find out your arrival time, please call [phone] between 1PM - 3PM on: 12/17/24 - Thursday If your arrival time is 6:00 am, do not arrive before that time as the [facility] entrance doors do not open until 6:00 am.  REMEMBER: Instructions that are not followed completely may result in serious medical risk, up to and including death; or upon the discretion of your surgeon and anesthesiologist your surgery may need to be rescheduled.  Do not eat food after midnight the night before surgery.  No gum chewing or hard candies.  You may however, drink CLEAR liquids up to 2 hours before you are scheduled to arrive for your surgery. Do not drink anything within 2 hours of your scheduled arrival time.  Clear liquids include: - water  - apple juice without pulp - gatorade (not RED colors) - black coffee or tea (Do NOT add milk or creamers to the coffee or tea) Do NOT drink anything that is not on this list.  In addition, your doctor has ordered for you to drink the provided:  Ensure Pre-Surgery Clear Carbohydrate Drink  Drinking this carbohydrate drink up to two hours before surgery helps to reduce insulin resistance and improve patient outcomes. Please complete drinking 2 hours before scheduled arrival time.  One week prior to surgery: Stop Anti-inflammatories (NSAIDS) such as Advil , Aleve, Ibuprofen , Motrin , Naproxen, Naprosyn and Aspirin  based products such as Excedrin, Goody's Powder, BC Powder. You may continue to take Tylenol  if needed for pain up until the day of surgery.  Stop ANY OVER THE COUNTER supplements until after surgery.  ON THE DAY OF SURGERY ONLY TAKE THESE MEDICATIONS WITH SIPS OF WATER:  none   No Alcohol for 24 hours before or after surgery.  No Smoking including e-cigarettes for 24 hours before surgery.  No chewable tobacco products for at  least 6 hours before surgery.  No nicotine patches on the day of surgery.  Do not use any recreational drugs for at least a week (preferably 2 weeks) before your surgery.  Please be advised that the combination of cocaine and anesthesia may have negative outcomes, up to and including death. If you test positive for cocaine, your surgery will be cancelled.  On the morning of surgery brush your teeth with toothpaste and water, you may rinse your mouth with mouthwash if you wish. Do not swallow any toothpaste or mouthwash.  Use CHG Soap or wipes as directed on instruction sheet.  Do not wear jewelry, make-up, hairpins, clips or nail polish.  For welded (permanent) jewelry: bracelets, anklets, waist bands, etc.  Please have this removed prior to surgery.  If it is not removed, there is a chance that hospital personnel will need to cut it off on the day of surgery.  Do not wear lotions, powders, or perfumes.   Do not shave body hair from the neck down 48 hours before surgery.  Contact lenses, hearing aids and dentures may not be worn into surgery.  Do not bring valuables to the hospital. [facility] is not responsible for any missing/lost belongings or valuables.   Notify your doctor if there is any change in your medical condition (cold, fever, infection).  Wear comfortable clothing (specific to your surgery type) to the hospital.  After surgery, you can help prevent lung complications by doing breathing exercises.  Take deep breaths and cough every  1-2 hours. Your doctor may order a device called an Incentive Spirometer to help you take deep breaths.  If you are being admitted to the hospital overnight, leave your suitcase in the car. After surgery it may be brought to your room.  In case of increased patient census, it may be necessary for you, the patient, to continue your postoperative care in the Same Day Surgery department.  If you are being discharged the day of surgery, you  will not be allowed to drive home. You will need a responsible individual to drive you home and stay with you for 24 hours after surgery.   If you are taking public transportation, you will need to have a responsible individual with you.  Please call the Pre-admissions Testing Dept. at [phone] if you have any questions about these instructions.  Surgery Visitation Policy:  Patients having surgery or a procedure may have two visitors.  Children under the age of 84 must have an adult with them who is not the patient.  Inpatient Visitation:    Visiting hours are 7 a.m. to 8 p.m. Up to four visitors are allowed at one time in a patient room. The visitors may rotate out with other people during the day.  One visitor age 45 or older may stay with the patient overnight and must be in the room by 8 p.m.   Merchandiser, Retail to address health-related social needs:  [email or web address]                                                                                                            Preparing for Surgery with CHLORHEXIDINE  GLUCONATE (CHG) Soap  Chlorhexidine  Gluconate (CHG) Soap  o An antiseptic cleaner that kills germs and bonds with the skin to continue killing germs even after washing  o Used for showering the night before surgery and morning of surgery  Before surgery, you can play an important role by reducing the number of germs on your skin.  CHG (Chlorhexidine  gluconate) soap is an antiseptic cleanser which kills germs and bonds with the skin to continue killing germs even after washing.  Please do not use if you have an allergy to CHG or antibacterial soaps. If your skin becomes reddened/irritated stop using the CHG.  1. Shower the NIGHT BEFORE SURGERY with CHG soap.  2. If you choose to wash your hair, wash your hair first as usual with your normal shampoo.  3. After shampooing, rinse your hair and body thoroughly to remove the  shampoo.  4. Use CHG as you would any other liquid soap. You can apply CHG directly to the skin and wash gently with a clean washcloth.  5. Apply the CHG soap to your body only from the neck down. Do not use on open wounds or open sores. Avoid contact with your eyes, ears, mouth, and genitals (private parts). Wash face and genitals (private parts) with your normal soap.  6. Wash thoroughly, paying special attention to the area where your surgery will be performed.  7. Thoroughly  rinse your body with warm water.  8. Do not shower/wash with your normal soap after using and rinsing off the CHG soap.  9. Do not use lotions, oils, etc., after showering with CHG.  10. Pat yourself dry with a clean towel.  11. Wear clean pajamas to bed the night before surgery.  12. Place clean sheets on your bed the night of your shower and do not sleep with pets.  13. Do not apply any deodorants/lotions/powders.  14. Please wear clean clothes to the hospital.  15. Remember to brush your teeth with your regular toothpaste.

## 2024-12-18 ENCOUNTER — Ambulatory Visit: Admitting: Anesthesiology

## 2024-12-18 ENCOUNTER — Ambulatory Visit

## 2024-12-18 ENCOUNTER — Other Ambulatory Visit: Payer: Self-pay

## 2024-12-18 ENCOUNTER — Encounter: Admission: RE | Disposition: A | Payer: Self-pay | Source: Home / Self Care | Attending: Orthopedic Surgery

## 2024-12-18 ENCOUNTER — Encounter: Payer: Self-pay | Admitting: Orthopedic Surgery

## 2024-12-18 ENCOUNTER — Ambulatory Visit
Admission: RE | Admit: 2024-12-18 | Discharge: 2024-12-18 | Disposition: A | Discharge status: Home / Self Care | Attending: Orthopedic Surgery | Admitting: Orthopedic Surgery

## 2024-12-18 DIAGNOSIS — G8929 Other chronic pain: Secondary | ICD-10-CM | POA: Diagnosis present

## 2024-12-18 DIAGNOSIS — M898X1 Other specified disorders of bone, shoulder: Secondary | ICD-10-CM | POA: Diagnosis present

## 2024-12-18 DIAGNOSIS — M67912 Unspecified disorder of synovium and tendon, left shoulder: Secondary | ICD-10-CM | POA: Insufficient documentation

## 2024-12-18 DIAGNOSIS — Z01818 Encounter for other preprocedural examination: Secondary | ICD-10-CM

## 2024-12-18 DIAGNOSIS — M25512 Pain in left shoulder: Secondary | ICD-10-CM | POA: Insufficient documentation

## 2024-12-18 DIAGNOSIS — X58XXXA Exposure to other specified factors, initial encounter: Secondary | ICD-10-CM | POA: Diagnosis not present

## 2024-12-18 DIAGNOSIS — S43432A Superior glenoid labrum lesion of left shoulder, initial encounter: Secondary | ICD-10-CM | POA: Insufficient documentation

## 2024-12-18 DIAGNOSIS — Z01812 Encounter for preprocedural laboratory examination: Secondary | ICD-10-CM

## 2024-12-18 HISTORY — PX: SHOULDER ARTHROSCOPY WITH LABRAL REPAIR: SHX5691

## 2024-12-18 LAB — HEMOGLOBIN AND HEMATOCRIT, BLOOD
HCT: 39.1 % (ref 36.0–46.0)
Hemoglobin: 12.6 g/dL (ref 12.0–15.0)

## 2024-12-18 LAB — POCT PREGNANCY, URINE: Preg Test, Ur: NEGATIVE

## 2024-12-18 MED ORDER — BUPIVACAINE HCL (PF) 0.5 % IJ SOLN
INTRAMUSCULAR | Status: DC | PRN
  Administered 2024-12-18: 10 mL via PERINEURAL

## 2024-12-18 MED ORDER — ACETAMINOPHEN 500 MG PO TABS: 1000.0000 mg | ORAL_TABLET | Freq: Three times a day (TID) | ORAL | 2 refills | Status: AC

## 2024-12-18 MED ORDER — CEFAZOLIN SODIUM-DEXTROSE 2-4 GM/100ML-% IV SOLN
INTRAVENOUS | Status: AC
  Filled 2024-12-18: qty 100

## 2024-12-18 MED ORDER — PHENYLEPHRINE HCL-NACL 20-0.9 MG/250ML-% IV SOLN
INTRAVENOUS | Status: DC | PRN
  Administered 2024-12-18: 30 ug/min via INTRAVENOUS

## 2024-12-18 MED ORDER — KETOROLAC TROMETHAMINE 0.5 % OP SOLN
1.0000 [drp] | Freq: Four times a day (QID) | OPHTHALMIC | Status: DC
  Administered 2024-12-18: 1 [drp] via OPHTHALMIC
  Filled 2024-12-18: qty 3

## 2024-12-18 MED ORDER — ONDANSETRON HCL 4 MG/2ML IJ SOLN
INTRAMUSCULAR | Status: AC
  Filled 2024-12-18: qty 2

## 2024-12-18 MED ORDER — CEFAZOLIN SODIUM-DEXTROSE 2-4 GM/100ML-% IV SOLN
2.0000 g | INTRAVENOUS | Status: AC
  Administered 2024-12-18: 2 g via INTRAVENOUS

## 2024-12-18 MED ORDER — ACETAMINOPHEN 10 MG/ML IV SOLN
INTRAVENOUS | Status: DC | PRN
  Administered 2024-12-18: 1000 mg via INTRAVENOUS

## 2024-12-18 MED ORDER — LIDOCAINE HCL (PF) 2 % IJ SOLN
INTRAMUSCULAR | Status: AC
  Filled 2024-12-18: qty 5

## 2024-12-18 MED ORDER — PROPOFOL 10 MG/ML IV BOLUS
INTRAVENOUS | Status: AC
  Filled 2024-12-18: qty 20

## 2024-12-18 MED ORDER — BUPIVACAINE LIPOSOME 1.3 % IJ SUSP
INTRAMUSCULAR | Status: AC
  Filled 2024-12-18: qty 20

## 2024-12-18 MED ORDER — FENTANYL CITRATE (PF) 50 MCG/ML IJ SOSY
50.0000 ug | PREFILLED_SYRINGE | Freq: Once | INTRAMUSCULAR | Status: AC
  Administered 2024-12-18: 50 ug via INTRAVENOUS

## 2024-12-18 MED ORDER — EPINEPHRINE PF 1 MG/ML IJ SOLN
INTRAMUSCULAR | Status: AC
  Filled 2024-12-18: qty 4

## 2024-12-18 MED ORDER — LACTATED RINGERS IR SOLN
Status: DC | PRN
  Administered 2024-12-18 (×2): 3000 mL

## 2024-12-18 MED ORDER — PROPOFOL 10 MG/ML IV BOLUS
INTRAVENOUS | Status: DC | PRN
  Administered 2024-12-18: 200 mg via INTRAVENOUS

## 2024-12-18 MED ORDER — ASPIRIN 325 MG PO TBEC: 325.0000 mg | DELAYED_RELEASE_TABLET | Freq: Every day | ORAL | 0 refills | Status: AC

## 2024-12-18 MED ORDER — LACTATED RINGERS IV SOLN: INTRAVENOUS | Status: DC

## 2024-12-18 MED ORDER — MIDAZOLAM HCL 2 MG/2ML IJ SOLN
INTRAMUSCULAR | Status: AC
  Filled 2024-12-18: qty 2

## 2024-12-18 MED ORDER — BUPIVACAINE HCL (PF) 0.5 % IJ SOLN
INTRAMUSCULAR | Status: AC
  Filled 2024-12-18: qty 10

## 2024-12-18 MED ORDER — FENTANYL CITRATE (PF) 100 MCG/2ML IJ SOLN
INTRAMUSCULAR | Status: AC
  Filled 2024-12-18: qty 2

## 2024-12-18 MED ORDER — LIDOCAINE HCL (PF) 1 % IJ SOLN
INTRAMUSCULAR | Status: AC
  Filled 2024-12-18: qty 5

## 2024-12-18 MED ORDER — CHLORHEXIDINE GLUCONATE 0.12 % MT SOLN
OROMUCOSAL | Status: AC
  Filled 2024-12-18: qty 15

## 2024-12-18 MED ORDER — FENTANYL CITRATE (PF) 50 MCG/ML IJ SOSY
PREFILLED_SYRINGE | INTRAMUSCULAR | Status: AC
  Filled 2024-12-18: qty 1

## 2024-12-18 MED ORDER — BSS IO SOLN
15.0000 mL | Freq: Once | INTRAOCULAR | Status: AC
  Administered 2024-12-18: 15 mL
  Filled 2024-12-18: qty 15

## 2024-12-18 MED ORDER — MIDAZOLAM HCL (PF) 2 MG/2ML IJ SOLN
INTRAMUSCULAR | Status: DC | PRN
  Administered 2024-12-18: 2 mg via INTRAVENOUS

## 2024-12-18 MED ORDER — OXYCODONE HCL 5 MG PO TABS: 5.0000 mg | ORAL_TABLET | ORAL | 0 refills | Status: AC | PRN

## 2024-12-18 MED ORDER — DEXAMETHASONE SOD PHOSPHATE PF 10 MG/ML IJ SOLN
INTRAMUSCULAR | Status: DC | PRN
  Administered 2024-12-18: 5 mg via INTRAVENOUS

## 2024-12-18 MED ORDER — GLYCOPYRROLATE 0.2 MG/ML IJ SOLN
INTRAMUSCULAR | Status: DC | PRN
  Administered 2024-12-18: .1 mg via INTRAVENOUS

## 2024-12-18 MED ORDER — LACTATED RINGERS IV SOLN
INTRAVENOUS | Status: DC | PRN
  Administered 2024-12-18: 3001 mL
  Administered 2024-12-18: 6002 mL

## 2024-12-18 MED ORDER — CHLORHEXIDINE GLUCONATE 0.12 % MT SOLN
15.0000 mL | Freq: Once | OROMUCOSAL | Status: AC
  Administered 2024-12-18: 15 mL via OROMUCOSAL

## 2024-12-18 MED ORDER — MIDAZOLAM HCL (PF) 2 MG/2ML IJ SOLN
1.0000 mg | INTRAMUSCULAR | Status: DC | PRN
  Administered 2024-12-18: 1 mg via INTRAVENOUS

## 2024-12-18 MED ORDER — ACETAMINOPHEN 10 MG/ML IV SOLN
INTRAVENOUS | Status: AC
  Filled 2024-12-18: qty 100

## 2024-12-18 MED ORDER — DROPERIDOL 2.5 MG/ML IJ SOLN: 0.6250 mg | Freq: Once | INTRAMUSCULAR | Status: DC | PRN

## 2024-12-18 MED ORDER — ORAL CARE MOUTH RINSE: 15.0000 mL | Freq: Once | OROMUCOSAL | Status: AC

## 2024-12-18 MED ORDER — BUPIVACAINE LIPOSOME 1.3 % IJ SUSP
INTRAMUSCULAR | Status: DC | PRN
  Administered 2024-12-18: 20 mL via PERINEURAL

## 2024-12-18 MED ORDER — LIDOCAINE HCL (PF) 1 % IJ SOLN
INTRAMUSCULAR | Status: DC | PRN
  Administered 2024-12-18: 3 mL via SUBCUTANEOUS

## 2024-12-18 MED ORDER — ONDANSETRON HCL 4 MG/2ML IJ SOLN
INTRAMUSCULAR | Status: DC | PRN
  Administered 2024-12-18: 4 mg via INTRAVENOUS

## 2024-12-18 MED ORDER — FENTANYL CITRATE (PF) 100 MCG/2ML IJ SOLN
INTRAMUSCULAR | Status: DC | PRN
  Administered 2024-12-18 (×2): 50 ug via INTRAVENOUS

## 2024-12-18 MED ORDER — ONDANSETRON 4 MG PO TBDP: 4.0000 mg | ORAL_TABLET | Freq: Three times a day (TID) | ORAL | 0 refills | Status: AC | PRN

## 2024-12-18 MED ORDER — SUCCINYLCHOLINE CHLORIDE 200 MG/10ML IV SOSY
PREFILLED_SYRINGE | INTRAVENOUS | Status: DC | PRN
  Administered 2024-12-18: 140 mg via INTRAVENOUS

## 2024-12-18 MED ORDER — FENTANYL CITRATE (PF) 100 MCG/2ML IJ SOLN: 25.0000 ug | INTRAMUSCULAR | Status: DC | PRN

## 2024-12-18 NOTE — Anesthesia Procedure Notes (Signed)
 Procedure Name: Intubation Date/Time: 12/18/2024 2:14 PM  Performed by: Myra Lawless, CRNAPre-anesthesia Checklist: Patient identified, Patient being monitored, Timeout performed, Emergency Drugs available and Suction available Patient Re-evaluated:Patient Re-evaluated prior to induction Oxygen Delivery Method: Circle system utilized Preoxygenation: Pre-oxygenation with 100% oxygen Induction Type: IV induction Ventilation: Mask ventilation without difficulty Laryngoscope Size: Mac and 3 Grade View: Grade I Tube type: Oral Tube size: 7.0 mm Number of attempts: 1 Airway Equipment and Method: Stylet Placement Confirmation: ETT inserted through vocal cords under direct vision, positive ETCO2 and breath sounds checked- equal and bilateral Secured at: 21 cm Tube secured with: Tape Dental Injury: Teeth and Oropharynx as per pre-operative assessment

## 2024-12-18 NOTE — H&P (Signed)
 Paper H&P to be scanned into permanent record. H&P reviewed. No significant changes noted.

## 2024-12-18 NOTE — Op Note (Signed)
 DATE: 12/18/2024    PRE-OP DIAGNOSIS:  1. Left shoulder posterior labral tear 2. Left shoulder subacromial exostosis   POST-OP DIAGNOSIS:  1. Left shoulder posterior labral tear 2. Left shoulder SLAP tear and biceps tendinopathy 3. Left shoulder subacromial exostosis   PROCEDURES:  1. Left shoulder arthroscopic posterior labral repair and capsulorraphy 2. Left arthroscopic biceps tenodesis 3. Left shoulder subacromial decompression 4. Left shoulder extensive arthroscopic debridement   SURGEON: Earnestine HILARIO Blanch, MD   ASSISTANT: None   ANESTHESIA: General + Exparel  interscalene block   TOTAL IV FLUIDS: per anesthesia record   ESTIMATED BLOOD LOSS: minimal    DRAINS: None    SPECIMENS: None.    IMPLANTS:  [employer] Knotless Qfix x 4 Arthrex 2.42mm PushLock x 1   COMPLICATIONS: None    Indications:  Jenny Grant  is a 24 y.o. female with over a 7-month history of shoulder pain imaging and/or clinical exam are concerning for a posterior labral tear, possible SLAP tear, and subacromial exostosis impinging on the rotator cuff.  The patient has failed non-operative management in the form of  medications, activity modifications, and corticosteroid injection. After discussion of risks, benefits, and alternatives to surgery, the patient elected to proceed.   Procedure Details  The patient was seen in the Holding Room. The risks, benefits, complications, treatment options, and expected outcomes were discussed with the patient. The risks and potential complications of the problem and proposed treatment were discussed. This includes but is not limited to failure to fully relieve pain, continued or recurrence of pain, continued instability, infection, neurovascular compromise, complications from anesthesia, stiff shoulder, bleeding, DVT, and reoperation. The patient concurred with the proposed plan, giving informed consent. The site of surgery was properly noted/marked.   The  patient was placed on the OR bed in the beach chair position. All bony prominences were well padded. The patient was given appropriate preoperative IV antibiotics. The upper extremity was prescrubbed with alcohol, prepped with Chloroprep, and draped in the usual sterile fashion. A Time Out Procedure was performed confirming correct patient, procedure, and laterality.   Exam under anesthesia showed: laxity anterior 1+, posterior 2+    An 11 blade was used to make a standard posterior viewing portal. A standard low inferior anterior portal was made as a working portal. I made an additional high anterolateral portal, which was used as the anterior viewing portal. We performed a thorough arthroscopic evaluation of the glenohumeral joint through both the anterior and posterior viewing portals.    Glenohumeral Joint: Articular cartilage of the humeral head and glenoid: Normal Synovium: injected and erythematous Anterior Labrum: Mild degenerative changes with sublabral foramen Posterior Labrum: Complete flattening of the labrum from 6:00 to 9:00 positions Superior Labrum: Type 1 SLAP tear Inferior Labrum: normal Anterior Capsule: normal Inferior Capsule: normal  Posterior Capsule: patulous  Rotator interval: normal, synovitic Superior glenohumeral ligament: normal  Middle glenohumeral ligament: normal  Inferior glenohumeral ligament: normal. No HAGL. Biceps: Significant erythema within the intra-articular portion Rotator cuff findings: normal subscapularis, supraspinatus, infraspinatus insertions on the articular side.   Next, the frayed edges of the anterior labrum were debrided with a shaver. The injected synovium was debrided with a combination of shaver and arthrocare wand.    Given the type I SLAP tear along with significant erythema about the biceps tendon and mild the patient's preoperative clinical exam, decision was made to perform arthroscopic biceps tenodesis. The Loop n Tack technique  was used to pass a FiberTape through  the biceps in a locked fashion adjacent to the biceps anchor.  A hole for a 2.9 mm Arthrex PushLock was drilled in the bicipital groove just superior to the subscapularis tendon insertion.  The biceps tendon was then cut and the biceps anchor complex was debrided down to a stable base on the superior labrum.  The FiberTape was loaded onto the PushLock anchor and impacted into place into the previously drilled hole in the bicipital groove.  This appropriately secured the biceps into the bicipital groove and took it off of tension.   The arthroscope was then placed in the anterolateral viewing portal. The elbow was placed in a position of slight anterior and inferior traction to allow for improved visualization. Next, a portal of [place] was made along the posterior 1/3 of the acromion just inferior to the lateral edge using a spinal needle to confirm appropriate positioning.  A  cannula was placed in the portal of [place]. An elevator was used to elevate the labrum off the glenoid posteriorly in the affected regions described above. A rasp and shaver were used to roughen the glenoid for improvement of healing.  We began by placing the first knotless Qfix at the 6:00 o'clock position using a curved guide.  Arthrex suture lasso was used to shuttle the stitch through the posteroinferior capsule and labrum adjacent to the anchor.  Appropriate passing mechanism was utilized and the suture was tensioned until the capsule and labrum were appropriately reduced with formation of an appropriate bumper.  The suture tail was cut flush with the articular cartilage. This process was repeated for anchors at the 7:00, 8:00, and 9:00 positions. The repair was stable to probing, there was visible evidence of decreased joint space and capsular area with formation of an appropriate inferior and posterior bumper. The shaver was used to debride any loose bony fragments from drilling.  The  arthroscope was then transitioned to the subacromial space.  There was significant bursitis which was debrided using combination of an oscillating shaver and ArthroCare wand.  Afterwards, the subacromial exostosis was identified readily.  It was located about the posterior acromion, posterior the region of the [facility] joint.  It measured approximately 8 x 6 mm.  Of note, there was significant fraying and partial-thickness tearing of the supraspinatus about the region of the musculotendinous junction.  With the arm in slight abduction, the subacromial exostosis clearly impinged about this region of the rotator cuff. The supraspinatus tendon itself covering the footprint was intact.    A burr was utilized to remove the exostosis and perform appropriate subacromial decompression.  An oscillating shaver was used to debride the bursal side of the supraspinatus.  Afterwards, there was no full-thickness tear on extensive probing. There was no residual impingement of the rotator cuff.  Given the mild partial-thickness tearing, decision was made to perform a side-to-side stitch in the small defect in the rotator cuff created by the portal of [place].  Utilizing a BirdBeak, a suture tape was passed across this defect.  Arthroscopic knot was tied, and suture was cut, thus nicely closing the defect.   That concluded the case. The wounds were closed with 3-0 nylon. Xeroform gauze and sterile dressings were applied.  Polar Care was placed. Patient was placed in a neutral shoulder immobilizer to prevent internal rotation.  Patient was then awakened from anesthesia and transported to PACU without notable complication.     POSTOPERATIVE PLAN:  Patient will be discharged to home.  Physical therapy 3-4 days after  surgery.  Return to the clinic 10-14 days postop for suture removal Maintain arm in immobilizer with neutral wedge. NWB. PT to start within 1 week utilizing arthroscopic posterior labral repair rehab protocol.

## 2024-12-18 NOTE — Discharge Instructions (Addendum)
 Post-Op Instructions  1. Bracing: You will wear a shoulder immobilizer or sling for 4 weeks.  Keep arm in a neutral or externally rotated position using the pillow with the sling. Do NOT rest forearm against belly -- forearm shoulder be resting in a position pointing in front of the body.   2. Driving: No driving for 4 weeks post-op.  3. Activity: No active lifting for 2 months. Wrist, hand, and elbow motion only. Avoid lifting the upper arm away from the body except for hygiene. You are permitted to bend and straighten the elbow actively. You may use your hand and wrist for typing, writing, and managing utensils (cutting food). Do not lift more than a coffee cup for 8 weeks.  When sleeping or resting, inclined positions (recliner chair or wedge pillow) and a pillow under the forearm for support may provide better comfort for up to 4 weeks.  Avoid long distance travel for 4 weeks.  Return to all activities after labral repair normally takes 6 months on average. If rehab goes very well, you may be able to do most activities at 4 months, except overhead or contact sports.  4. Physical Therapy: Begins 3-4 days after surgery, and proceeds 2 times per week for the first 4 weeks, then 1-2 times per week from weeks 4-8 post-op.  5. Medications:  - You will be provided a prescription for narcotic pain medicine. After surgery, take 1-2 narcotic tablets every 4 hours if needed for severe pain.  - A prescription for anti-nausea medication will be provided in case the narcotic medicine causes nausea - take 1 tablet every 6 hours only if nauseated.   - Take tylenol  1000 mg every 8 hours for pain.  May stop tylenol  3 days after surgery if you are having minimal pain. - Take ASA 325mg /day x 2 weeks to help prevent DVT/PE (blood clots).   If you are taking prescription medication for anxiety, depression, insomnia, muscle spasm, chronic pain, or for attention deficit disorder, you are advised that you are at a  higher risk of adverse effects with use of narcotics post-op, including narcotic addiction/dependence, depressed breathing, death. If you use non-prescribed substances: alcohol, marijuana, cocaine, heroin, methamphetamines, etc., you are at a higher risk of adverse effects with use of narcotics post-op, including narcotic addiction/dependence, depressed breathing, death. You are advised that taking > 50 morphine  milligram equivalents (MME) of narcotic pain medication per day results in twice the risk of overdose or death. For your prescription provided: oxycodone  5 mg - taking more than 6 tablets per day would result in > 50 morphine  milligram equivalents (MME) of narcotic pain medication. Be advised that we will prescribe narcotics short-term, for acute post-operative pain only - 3 weeks for major operations such as shoulder repair/reconstruction surgeries.   6. Post-Op Appointment:  Your first post-op appointment will be 10-14 days post-op.  7. Work or School: For most, but not all procedures, we advise staying out of work or school for at least 1 to 2 weeks in order to recover from the stress of surgery and to allow time for healing.   If you need a work or school note this can be provided.   Post-operative Brace: Apply and remove the brace you received as you were instructed to at the time of fitting and as described in detail as the braces instructions for use indicate.  Wear the brace for the period of time prescribed by your physician.  The brace can be cleaned with soap  and water and allowed to air dry only.  Should the brace result in increased pain, decreased feeling (numbness/tingling), increased swelling or an overall worsening of your medical condition, please contact your doctor immediately.  If an emergency situation occurs as a result of wearing the brace after normal business hours, please dial 911 and seek immediate medical attention.  Let your doctor know if you have any further  questions about the brace issued to you. Refer to the shoulder sling instructions for use if you have any questions regarding the correct fit of your shoulder sling.  [facility] Customer Care for Troubleshooting: [phone]  Video that illustrates how to properly use a shoulder sling: Instructions for Proper Use of an Orthopaedic Sling [email or web address].com/    Please contact your eye doctor if right eye pain is not significantly better in 24 hours.

## 2024-12-18 NOTE — Anesthesia Preprocedure Evaluation (Addendum)
"                                    Anesthesia Evaluation  Patient identified by MRN, date of birth, ID band Patient awake    Reviewed: Allergy & Precautions, H&P , NPO status , Patient's Chart, lab work & pertinent test results, reviewed documented beta blocker date and time   History of Anesthesia Complications Negative for: history of anesthetic complications  Airway Mallampati: II  TM Distance: >3 FB Neck ROM: full    Dental  (+) Dental Advidsory Given, Caps, Teeth Intact, Missing   Pulmonary neg pulmonary ROS   Pulmonary exam normal breath sounds clear to auscultation       Cardiovascular Exercise Tolerance: Good negative cardio ROS Normal cardiovascular exam Rhythm:regular Rate:Normal     Neuro/Psych  PSYCHIATRIC DISORDERS Anxiety Depression    negative neurological ROS     GI/Hepatic negative GI ROS, Neg liver ROS,,,  Endo/Other  negative endocrine ROS    Renal/GU negative Renal ROS  negative genitourinary   Musculoskeletal   Abdominal   Peds  Hematology negative hematology ROS (+)   Anesthesia Other Findings Past Medical History: No date: Anxiety No date: Depression No date: Headache(784.0) No date: Migraines No date: Pneumonia No date: Shoulder pain, left   Reproductive/Obstetrics negative OB ROS                              Anesthesia Physical Anesthesia Plan  ASA: 2  Anesthesia Plan: General   Post-op Pain Management: Regional block*   Induction: Intravenous  PONV Risk Score and Plan: 3 and Ondansetron , Dexamethasone , Midazolam  and Treatment may vary due to age or medical condition  Airway Management Planned: Oral ETT  Additional Equipment:   Intra-op Plan:   Post-operative Plan: Extubation in OR  Informed Consent: I have reviewed the patients History and Physical, chart, labs and discussed the procedure including the risks, benefits and alternatives for the proposed anesthesia with the  patient or authorized representative who has indicated his/her understanding and acceptance.     Dental Advisory Given  Plan Discussed with: Anesthesiologist, CRNA and Surgeon  Anesthesia Plan Comments:          Anesthesia Quick Evaluation  "

## 2024-12-18 NOTE — Transfer of Care (Signed)
 Immediate Anesthesia Transfer of Care Note  Patient: Jenny Grant  Procedure(s) Performed: ARTHROSCOPY, SHOULDER WITH DEBRIDEMENT (Left: Shoulder) ARTHROSCOPY, SHOULDER, WITH GLENOID LABRUM REPAIR (Left: Shoulder)  Patient Location: PACU  Anesthesia Type:General  Level of Consciousness: awake, alert , and oriented  Airway & Oxygen Therapy: Patient Spontanous Breathing  Post-op Assessment: Report given to RN and Post -op Vital signs reviewed and stable  Post vital signs: Reviewed and stable  Last Vitals:  Vitals Value Taken Time  BP 126/80 12/18/24 16:45  Temp 35.9 1645  Pulse 74 12/18/24 16:45  Resp 16 12/18/24 16:45  SpO2 97 % 12/18/24 16:45  Vitals shown include unfiled device data.  Last Pain:  Vitals:   12/18/24 1237  TempSrc: Temporal  PainSc: 0-No pain         Complications: No notable events documented.

## 2024-12-18 NOTE — Anesthesia Postprocedure Evaluation (Signed)
"   Anesthesia Post Note  Patient: Jenny Grant  Procedure(s) Performed: ARTHROSCOPY, SHOULDER WITH DEBRIDEMENT (Left: Shoulder) ARTHROSCOPY, SHOULDER, WITH GLENOID LABRUM REPAIR (Left: Shoulder)  Patient location during evaluation: PACU Anesthesia Type: General Level of consciousness: awake and alert Pain management: pain level controlled Vital Signs Assessment: post-procedure vital signs reviewed and stable Respiratory status: spontaneous breathing, nonlabored ventilation, respiratory function stable and patient connected to nasal cannula oxygen Cardiovascular status: blood pressure returned to baseline and stable Postop Assessment: no apparent nausea or vomiting Anesthetic complications: no   No notable events documented.   Last Vitals:  Vitals:   12/18/24 1724 12/18/24 1737  BP:  106/72  Pulse: 64 63  Resp: 14 16  Temp:  (!) 36.4 C  SpO2: 100% 100%    Last Pain:  Vitals:   12/18/24 1737  TempSrc: Temporal  PainSc: 7                  Prentice Murphy      "

## 2024-12-18 NOTE — Anesthesia Procedure Notes (Signed)
 Anesthesia Regional Block: Interscalene brachial plexus block   Pre-Anesthetic Checklist: , timeout performed,  Correct Patient, Correct Site, Correct Laterality,  Correct Procedure, Correct Position, site marked,  Risks and benefits discussed,  Surgical consent,  Pre-op evaluation,  At surgeon's request and post-op pain management  Laterality: Left and Upper  Prep: chloraprep       Needles:  Injection technique: Single-shot  Needle Type: Stimiplex     Needle Length: 5cm  Needle Gauge: 22     Additional Needles:   Procedures:,,,, ultrasound used (permanent image in chart),,    Narrative:  Start time: 12/18/2024 1:37 PM End time: 12/18/2024 1:40 PM Injection made incrementally with aspirations every 5 mL.  Performed by: Personally  Anesthesiologist: Dario Barter, MD  Additional Notes: Functioning IV was confirmed and monitors were applied.  A 50mm 22ga Stimuplex needle was used. Sterile prep and drape,hand hygiene and sterile gloves were used.  Negative aspiration and negative test dose prior to incremental administration of local anesthetic. The patient tolerated the procedure well.

## 2024-12-22 ENCOUNTER — Encounter: Payer: Self-pay | Admitting: Orthopedic Surgery
